# Patient Record
Sex: Female | Born: 1980 | Race: Black or African American | Hispanic: No | Marital: Single | Smoking: Current every day smoker
Health system: Southern US, Community
[De-identification: ages and names within clinical notes are randomized; demographics above are authoritative.]

## PROBLEM LIST (undated history)

## (undated) DIAGNOSIS — D509 Iron deficiency anemia, unspecified: Secondary | ICD-10-CM

## (undated) DIAGNOSIS — B009 Herpesviral infection, unspecified: Secondary | ICD-10-CM

## (undated) DIAGNOSIS — Z8619 Personal history of other infectious and parasitic diseases: Secondary | ICD-10-CM

## (undated) DIAGNOSIS — Z8719 Personal history of other diseases of the digestive system: Secondary | ICD-10-CM

## (undated) DIAGNOSIS — K219 Gastro-esophageal reflux disease without esophagitis: Secondary | ICD-10-CM

## (undated) DIAGNOSIS — Z8711 Personal history of peptic ulcer disease: Secondary | ICD-10-CM

## (undated) HISTORY — DX: Personal history of other infectious and parasitic diseases: Z86.19

## (undated) HISTORY — DX: Personal history of other diseases of the digestive system: Z87.19

## (undated) HISTORY — DX: Herpesviral infection, unspecified: B00.9

## (undated) HISTORY — DX: Personal history of peptic ulcer disease: Z87.11

## (undated) HISTORY — PX: ESOPHAGOGASTRODUODENOSCOPY: SHX1529

---
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
## 2013-01-20 HISTORY — PX: OTHER SURGICAL HISTORY: SHX169

## 2016-11-20 LAB — HM PAP SMEAR: HM Pap smear: NORMAL

## 2017-07-13 ENCOUNTER — Ambulatory Visit: Payer: Self-pay | Admitting: Family

## 2017-07-15 ENCOUNTER — Ambulatory Visit: Payer: BC Managed Care – PPO | Admitting: Family

## 2017-07-15 ENCOUNTER — Encounter: Payer: Self-pay | Admitting: Family

## 2017-07-15 VITALS — BP 120/76 | HR 73 | Temp 98.1°F | Resp 16 | Ht 62.5 in | Wt 148.0 lb

## 2017-07-15 DIAGNOSIS — F439 Reaction to severe stress, unspecified: Secondary | ICD-10-CM | POA: Diagnosis not present

## 2017-07-15 DIAGNOSIS — K219 Gastro-esophageal reflux disease without esophagitis: Secondary | ICD-10-CM | POA: Insufficient documentation

## 2017-07-15 DIAGNOSIS — J309 Allergic rhinitis, unspecified: Secondary | ICD-10-CM | POA: Insufficient documentation

## 2017-07-15 DIAGNOSIS — F4329 Adjustment disorder with other symptoms: Secondary | ICD-10-CM | POA: Diagnosis not present

## 2017-07-15 NOTE — Progress Notes (Signed)
Subjective:    Patient ID: Emily Robbins, female    DOB: [birth year], 37 y.o.   MRN: [id number]  HPI  Pt is a 37 yr old female who presents today to establish care. Having work stress and family stress.  Moved here recently from [place]. Reports that she has a lot of stress with her dad who has never been very supportive of her.  Her partner is not working currently so she is the sole income for her family which she finds to be very stressful. She does not feel welcomed in her current community in [place] and hopes to move in the near future.  Denies depression symptoms. She is open to counseling.   Hx of ulcers- reports that she had an endoscopy at age 18.  She was treated with abx and pantoprazole.  Reports that she has intermittent reflux symptoms.    Seasonal allergies- mostly in the spring, takes zyrtec.   Review of Systems    see HPI  Past Medical History:  Diagnosis Date  . History of stomach ulcers      Social History   Socioeconomic History  . Marital status: Single    Spouse name: Not on file  . Number of children: Not on file  . Years of education: Not on file  . Highest education level: Not on file  Occupational History  . Not on file  Social Needs  . Financial resource strain: Not on file  . Food insecurity:    Worry: Not on file    Inability: Not on file  . Transportation needs:    Medical: Not on file    Non-medical: Not on file  Tobacco Use  . Smoking status: Current Every Day Smoker    Packs/day: 5.00    Start date: 01/21/2000  . Smokeless tobacco: Never Used  Substance and Sexual Activity  . Alcohol use: Yes    Alcohol/week: 0.6 oz    Types: 1 Standard drinks or equivalent per week    Comment: occasional  . Drug use: Not on file  . Sexual activity: Not on file  Lifestyle  . Physical activity:    Days per week: Not on file    Minutes per session: Not on file  . Stress: Not on file  Relationships  . Social connections:    Talks  on phone: Not on file    Gets together: Not on file    Attends religious service: Not on file    Active member of club or organization: Not on file    Attends meetings of clubs or organizations: Not on file    Relationship status: Not on file  . Intimate partner violence:    Fear of current or ex partner: Not on file    Emotionally abused: Not on file    Physically abused: Not on file    Forced sexual activity: Not on file  Other Topics Concern  . 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 OTHER SURGICAL HISTORY  2015   boil removed from buttock    Family History  Problem Relation Age of Onset  . Hypertension Mother   . Other Mother        borderline diabetes  . Hypertension Father   . Asthma Father   .  Hyperlipidemia Father   . Hypertension Brother   . Hypertension Maternal Grandmother   . Diabetes Maternal Grandmother   . Seizures Maternal Grandmother   . Hypertension Brother     No Known Allergies  Current Outpatient Medications on File Prior to Visit  Medication Sig Dispense Refill  . cetirizine (ZYRTEC) 10 MG tablet Take 10 mg by mouth daily as needed for allergies.     No current facility-administered medications on file prior to visit.     BP 120/76 (BP Location: Right Arm, Cuff Size: Normal)   Pulse 73   Temp 98.1 F (36.7 C) (Oral)   Resp 16   Ht 5' 2.5" (1.588 m)   Wt 148 lb (67.1 kg)   LMP 07/11/2017   SpO2 100%   BMI 26.64 kg/m    Objective:   Physical Exam  Constitutional: She is oriented to person, place, and time. She appears well-developed and well-nourished.  Cardiovascular: Normal rate, regular rhythm and normal heart sounds.  No murmur heard. Pulmonary/Chest: Effort normal and breath sounds normal. No respiratory distress. She has no wheezes.    Musculoskeletal: She exhibits no edema.  Neurological: She is alert and oriented to person, place, and time.  Skin: Skin is warm and dry.  Psychiatric: She has a normal mood and affect. Her behavior is normal. Judgment and thought content normal.          Assessment & Plan:  Stress- I think she would really benefit from counseling. I do not see any indication for medication at this time.   A total of 30  minutes were spent face-to-face with the patient during this encounter and over half of that time was spent on counseling and coordination of care. The patient was counseled on stress management, importance of counseling, gerd diet.

## 2017-07-15 NOTE — Assessment & Plan Note (Signed)
Intermittent gerd symptoms. Advised pt on gerd diet and prn use of antacids such as tums. If needed we can consider adding PPI back but at this time she seems to be doing well off of medication.

## 2017-07-15 NOTE — Patient Instructions (Addendum)
Please schedule an appointment with a counselor. Work on reflux friendly diet.   Food Choices for Gastroesophageal Reflux Disease, Adult When you have gastroesophageal reflux disease (GERD), the foods you eat and your eating habits are very important. Choosing the right foods can help ease your discomfort. What guidelines do I need to follow?  Choose fruits, vegetables, whole grains, and low-fat dairy products.  Choose low-fat meat, fish, and poultry.  Limit fats such as oils, salad dressings, butter, nuts, and avocado.  Keep a food diary. This helps you identify foods that cause symptoms.  Avoid foods that cause symptoms. These may be different for everyone.  Eat small meals often instead of 3 large meals a day.  Eat your meals slowly, in a place where you are relaxed.  Limit fried foods.  Cook foods using methods other than frying.  Avoid drinking alcohol.  Avoid drinking large amounts of liquids with your meals.  Avoid bending over or lying down until 2-3 hours after eating. What foods are not recommended? These are some foods and drinks that may make your symptoms worse: Vegetables Tomatoes. Tomato juice. Tomato and spaghetti sauce. Chili peppers. Onion and garlic. Horseradish. Fruits Oranges, grapefruit, and lemon (fruit and juice). Meats High-fat meats, fish, and poultry. This includes hot dogs, ribs, ham, sausage, salami, and bacon. Dairy Whole milk and chocolate milk. Sour cream. Cream. Butter. Ice cream. Cream cheese. Drinks Coffee and tea. Bubbly (carbonated) drinks or energy drinks. Condiments Hot sauce. Barbecue sauce. Sweets/Desserts Chocolate and cocoa. Donuts. Peppermint and spearmint. Fats and Oils High-fat foods. This includes [place] fries and potato chips. Other Vinegar. Strong spices. This includes black pepper, white pepper, red pepper, cayenne, curry powder, cloves, ginger, and chili powder. The items listed above may not be a complete list of  foods and drinks to avoid. Contact your dietitian for more information. This information is not intended to replace advice given to you by your health care provider. Make sure you discuss any questions you have with your health care provider. Document Released: 07/08/2011 Document Revised: 06/14/2015 Document Reviewed: 11/10/2012 Elsevier Interactive Patient Education  2017 [employer].

## 2017-07-15 NOTE — Assessment & Plan Note (Signed)
Seasonal and controlled with prn use of zofran.

## 2017-08-21 ENCOUNTER — Encounter: Payer: Self-pay | Admitting: Family

## 2017-08-21 ENCOUNTER — Ambulatory Visit: Payer: BC Managed Care – PPO | Admitting: Family

## 2017-08-21 VITALS — BP 126/88 | HR 83 | Temp 98.4°F | Resp 16 | Ht 63.0 in | Wt 149.0 lb

## 2017-08-21 DIAGNOSIS — N946 Dysmenorrhea, unspecified: Secondary | ICD-10-CM | POA: Diagnosis not present

## 2017-08-21 DIAGNOSIS — K219 Gastro-esophageal reflux disease without esophagitis: Secondary | ICD-10-CM | POA: Diagnosis not present

## 2017-08-21 DIAGNOSIS — F4329 Adjustment disorder with other symptoms: Secondary | ICD-10-CM

## 2017-08-21 NOTE — Patient Instructions (Signed)
Please complete lab work prior to leaving.   

## 2017-08-21 NOTE — Progress Notes (Signed)
Subjective:    Patient ID: Emily Robbins, female    DOB: [birth year], 37 y.o.   MRN: [id number]  HPI  [name] is a 37 yr old female who presents today for follow up.  Stress- partner is working now.  Reports decreased stress recently.   GERD-  Improved. Has been working on diet and increasing her water intake.   Premenstrual cramping- cramps x 1 week prior to menses.  Has nipple soreness.  She reports that she has been on oral contraceptives in the past but had a lot of spotting with this.  She has also taken Depakote in the past and it "blew me up".  Review of Systems See HPI  Past Medical History:  Diagnosis Date  . History of stomach ulcers      Social History   Socioeconomic History  . Marital status: Single    Spouse name: Not on file  . Number of children: Not on file  . Years of education: Not on file  . Highest education level: Not on file  Occupational History  . Not on file  Social Needs  . Financial resource strain: Not on file  . Food insecurity:    Worry: Not on file    Inability: Not on file  . Transportation needs:    Medical: Not on file    Non-medical: Not on file  Tobacco Use  . Smoking status: Current Every Day Smoker    Packs/day: 5.00    Start date: 01/21/2000  . Smokeless tobacco: Never Used  Substance and Sexual Activity  . Alcohol use: Yes    Alcohol/week: 0.6 oz    Types: 1 Standard drinks or equivalent per week    Comment: occasional  . Drug use: Not on file  . Sexual activity: Not on file  Lifestyle  . Physical activity:    Days per week: Not on file    Minutes per session: Not on file  . Stress: Not on file  Relationships  . Social connections:    Talks on phone: Not on file    Gets together: Not on file    Attends religious service: Not on file    Active member of club or organization: Not on file    Attends meetings of clubs or organizations: Not on file    Relationship status: Not on file  . Intimate partner  violence:    Fear of current or ex partner: Not on file    Emotionally abused: Not on file    Physically abused: Not on file    Forced sexual activity: Not on file  Other Topics Concern  . 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 OTHER SURGICAL HISTORY  2015   boil removed from buttock    Family History  Problem Relation Age of Onset  . Hypertension Mother   . Other Mother        borderline diabetes  . Hypertension Father   . Asthma Father   . Hyperlipidemia Father   . Hypertension Brother   . Hypertension Maternal Grandmother   . Diabetes Maternal Grandmother   . Seizures Maternal Grandmother   . Hypertension Brother     No Known Allergies  Current Outpatient Medications on  File Prior to Visit  Medication Sig Dispense Refill  . cetirizine (ZYRTEC) 10 MG tablet Take 10 mg by mouth daily as needed for allergies.     No current facility-administered medications on file prior to visit.     BP 126/88 (BP Location: Right Arm, Cuff Size: Normal)   Pulse 83   Temp 98.4 F (36.9 C) (Oral)   Resp 16   Ht 5\' 3"  (1.6 m)   Wt 149 lb (67.6 kg)   LMP 07/20/2017   SpO2 100%   BMI 26.39 kg/m       Objective:   Physical Exam  Constitutional: She appears well-developed and well-nourished.  Cardiovascular: Normal rate, regular rhythm and normal heart sounds.  No murmur heard. Pulmonary/Chest: Effort normal and breath sounds normal. No respiratory distress. She has no wheezes.  Psychiatric: She has a normal mood and affect. Her behavior is normal. Judgment and thought content normal.          Assessment & Plan:  Stress- she reports improvement in her stress level.  She did not end up establishing with a counselor as things were improving.  Encouraged her to  reach out to counselor if she feels let she needs it in the future.  Dysmenorrhea-we discussed possibility of initiating a different oral contraceptive.  She declines at this time wishes to continue to use Tylenol as needed.  GERD-GERD symptoms are improved with dietary changes.  We will continue to monitor.

## 2017-09-23 ENCOUNTER — Encounter: Payer: Self-pay | Admitting: Family

## 2017-09-24 ENCOUNTER — Telehealth: Payer: BC Managed Care – PPO | Admitting: Family

## 2017-09-24 ENCOUNTER — Encounter: Payer: Self-pay | Admitting: Family

## 2017-09-24 DIAGNOSIS — R21 Rash and other nonspecific skin eruption: Secondary | ICD-10-CM

## 2017-09-24 MED ORDER — PREDNISONE 5 MG PO TABS: 5.0000 mg | ORAL_TABLET | ORAL | 0 refills | Status: DC

## 2017-09-24 NOTE — Progress Notes (Signed)
Thank you for the details you included in the comment boxes. Those details are very helpful in determining the best course of treatment for you and help [place] to provide the best care.  E Visit for Rash  We are sorry that you are not feeling well. Here is how we plan to help!   Based on what you shared with me you may have a virus or an allergic reaction.  Avoid contact with pregnant women until a diagnosis is made.  Most viral rashes are contagious (especially if a fever is present).  You can return to work or school after the rash is gone or when your doctor says it is safe to return with the rash. If you do not have a fever, then it is likely just an allergic reaction and the precautions about being at work and around other people does NOT apply. This is part of our template, but usually there is no fever.    Prednisone 5 mg daily for 6 days (see taper instructions below)  Directions for 6 day taper: Day 1: 2 tablets before breakfast, 1 after both lunch & dinner and 2 at bedtime Day 2: 1 tab before breakfast, 1 after both lunch & dinner and 2 at bedtime Day 3: 1 tab at each meal & 1 at bedtime Day 4: 1 tab at breakfast, 1 at lunch, 1 at bedtime Day 5: 1 tab at breakfast & 1 tab at bedtime Day 6: 1 tab at breakfast    HOME CARE:   Take cool showers and avoid direct sunlight.  Apply cool compress or wet dressings.  Take a bath in an oatmeal bath.  Sprinkle content of one Aveeno packet under running faucet with comfortably warm water.  Bathe for 15-20 minutes, 1-2 times daily.  Pat dry with a towel. Do not rub the rash.  Use hydrocortisone cream.  Take an antihistamine like Benadryl for widespread rashes that itch.  The adult dose of Benadryl is 25-50 mg by mouth 4 times daily.  Caution:  This type of medication may cause sleepiness.  Do not drink alcohol, drive, or operate dangerous machinery while taking antihistamines.  Do not take these medications if you have prostate  enlargement.  Read package instructions thoroughly on all medications that you take.  GET HELP RIGHT AWAY IF:   Symptoms don't go away after treatment.  Severe itching that persists.  If you rash spreads or swells.  If you rash begins to smell.  If it blisters and opens or develops a yellow-brown crust.  You develop a fever.  You have a sore throat.  You become short of breath.  MAKE SURE YOU:  Understand these instructions. Will watch your condition. Will get help right away if you are not doing well or get worse.  Thank you for choosing an e-visit. Your e-visit answers were reviewed by a board certified advanced clinical practitioner to complete your personal care plan. Depending upon the condition, your plan could have included both over the counter or prescription medications. Please review your pharmacy choice. Be sure that the pharmacy you have chosen is open so that you can pick up your prescription now.  If there is a problem you may message your provider in [place] to have the prescription routed to another pharmacy. Your safety is important to [place]. If you have drug allergies check your prescription carefully.  For the next 24 hours, you can use MyChart to ask questions about today's visit, request a non-urgent call  back, or ask for a work or school excuse from your e-visit provider. You will get an email in the next two days asking about your experience. I hope that your e-visit has been valuable and will speed your recovery.

## 2017-09-25 NOTE — Telephone Encounter (Signed)
See previous email and response.

## 2017-10-06 ENCOUNTER — Encounter: Payer: Self-pay | Admitting: Family

## 2017-10-06 ENCOUNTER — Ambulatory Visit (INDEPENDENT_AMBULATORY_CARE_PROVIDER_SITE_OTHER): Payer: BC Managed Care – PPO | Admitting: Family

## 2017-10-06 VITALS — BP 110/68 | HR 74 | Temp 98.8°F | Resp 16 | Ht 62.7 in | Wt 145.6 lb

## 2017-10-06 DIAGNOSIS — Z0000 Encounter for general adult medical examination without abnormal findings: Secondary | ICD-10-CM | POA: Diagnosis not present

## 2017-10-06 LAB — CBC WITH DIFFERENTIAL/PLATELET
BASOS ABS: 0.1 10*3/uL (ref 0.0–0.1)
Basophils Relative: 1.3 % (ref 0.0–3.0)
Eosinophils Absolute: 0.3 10*3/uL (ref 0.0–0.7)
Eosinophils Relative: 4 % (ref 0.0–5.0)
HCT: 38.2 % (ref 36.0–46.0)
Hemoglobin: 12.2 g/dL (ref 12.0–15.0)
LYMPHS ABS: 2.9 10*3/uL (ref 0.7–4.0)
Lymphocytes Relative: 32.7 % (ref 12.0–46.0)
MCHC: 31.9 g/dL (ref 30.0–36.0)
MCV: 79.9 fl (ref 78.0–100.0)
MONO ABS: 0.9 10*3/uL (ref 0.1–1.0)
Monocytes Relative: 9.9 % (ref 3.0–12.0)
NEUTROS ABS: 4.6 10*3/uL (ref 1.4–7.7)
NEUTROS PCT: 52.1 % (ref 43.0–77.0)
PLATELETS: 371 10*3/uL (ref 150.0–400.0)
RBC: 4.79 Mil/uL (ref 3.87–5.11)
RDW: 18.6 % — ABNORMAL HIGH (ref 11.5–15.5)
WBC: 8.7 10*3/uL (ref 4.0–10.5)

## 2017-10-06 LAB — BASIC METABOLIC PANEL
BUN: 16 mg/dL (ref 6–23)
CALCIUM: 9.7 mg/dL (ref 8.4–10.5)
CO2: 25 mEq/L (ref 19–32)
Chloride: 106 mEq/L (ref 96–112)
Creatinine, Ser: 0.8 mg/dL (ref 0.40–1.20)
GFR: 104.92 mL/min (ref >60.00)
GLUCOSE: 83 mg/dL (ref 70–99)
Potassium: 4.4 mEq/L (ref 3.5–5.1)
Sodium: 138 mEq/L (ref 135–145)

## 2017-10-06 LAB — LIPID PANEL
CHOL/HDL RATIO: 4
Cholesterol: 176 mg/dL (ref 0–200)
HDL: 43.9 mg/dL (ref >39.00)
LDL Cholesterol: 115 mg/dL — ABNORMAL HIGH (ref 0–99)
NONHDL: 132.31
Triglycerides: 88 mg/dL (ref 0.0–149.0)
VLDL: 17.6 mg/dL (ref 0.0–40.0)

## 2017-10-06 LAB — HEPATIC FUNCTION PANEL
ALK PHOS: 53 U/L (ref 39–117)
ALT: 8 U/L (ref 0–35)
AST: 11 U/L (ref 0–37)
Albumin: 4.3 g/dL (ref 3.5–5.2)
BILIRUBIN TOTAL: 0.2 mg/dL (ref 0.2–1.2)
Bilirubin, Direct: 0 mg/dL (ref 0.0–0.3)
Total Protein: 7.2 g/dL (ref 6.0–8.3)

## 2017-10-06 LAB — URINALYSIS, ROUTINE W REFLEX MICROSCOPIC
Bilirubin Urine: NEGATIVE
HGB URINE DIPSTICK: NEGATIVE
KETONES UR: NEGATIVE
Leukocytes, UA: NEGATIVE
NITRITE: NEGATIVE
PH: 6.5 (ref 5.0–8.0)
RBC / HPF: NONE SEEN (ref 0–?)
Specific Gravity, Urine: 1.02 (ref 1.000–1.030)
TOTAL PROTEIN, URINE-UPE24: NEGATIVE
UROBILINOGEN UA: 0.2 (ref 0.0–1.0)
Urine Glucose: NEGATIVE

## 2017-10-06 LAB — TSH: TSH: 0.77 u[IU]/mL (ref 0.35–4.50)

## 2017-10-06 NOTE — Patient Instructions (Addendum)
Please schedule routine dental and vision exam.  Complete lab work prior to leaving.

## 2017-10-06 NOTE — Progress Notes (Signed)
Subjective:    Patient ID: Emily Robbins, female    DOB: 10/13/1980, 37 y.o.   MRN: [id number]  HPI  Patient presents today for complete physical.  Immunizations:  Tetanus 2015, declines flu shot Diet: healthy Wt Readings from Last 3 Encounters:  10/06/17 145 lb 9.6 oz (66 kg)  08/21/17 149 lb (67.6 kg)  07/15/17 148 lb (67.1 kg)  Exercise: not exercising Pap Smear: 11/20/16 Dental: due Vision: up to date  Review of Systems  Constitutional: Negative for unexpected weight change.  HENT: Negative for hearing loss and rhinorrhea.   Eyes: Negative for visual disturbance.  Respiratory: Negative for cough.   Cardiovascular: Negative for leg swelling.  Gastrointestinal: Negative for blood in stool, constipation and diarrhea.  Genitourinary: Negative for dysuria, frequency and hematuria.  Musculoskeletal: Negative for arthralgias and myalgias.  Skin: Negative for rash.  Neurological:       Occasional headaches  Hematological: Negative for adenopathy.  Psychiatric/Behavioral:       + anxiety related to work.     Past Medical History:  Diagnosis Date  . History of stomach ulcers      Social History   Socioeconomic History  . Marital status: Single    Spouse name: Not on file  . Number of children: Not on file  . Years of education: Not on file  . Highest education level: Not on file  Occupational History  . Not on file  Social Needs  . Financial resource strain: Not on file  . Food insecurity:    Worry: Not on file    Inability: Not on file  . Transportation needs:    Medical: Not on file    Non-medical: Not on file  Tobacco Use  . Smoking status: Current Every Day Smoker    Packs/day: 5.00    Start date: 01/21/2000  . Smokeless tobacco: Never Used  Substance and Sexual Activity  . Alcohol use: Yes    Alcohol/week: 1.0 standard drinks    Types: 1 Standard drinks or equivalent per week    Comment: occasional  . Drug use: Not on file  . Sexual activity:  Not on file  Lifestyle  . Physical activity:    Days per week: Not on file    Minutes per session: Not on file  . Stress: Not on file  Relationships  . Social connections:    Talks on phone: Not on file    Gets together: Not on file    Attends religious service: Not on file    Active member of club or organization: Not on file    Attends meetings of clubs or organizations: Not on file    Relationship status: Not on file  . Intimate partner violence:    Fear of current or ex partner: Not on file    Emotionally abused: Not on file    Physically abused: Not on file    Forced sexual activity: Not on file  Other Topics Concern  . 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 OTHER SURGICAL HISTORY  2015   boil removed from buttock    Family History  Problem Relation Age of Onset  . Hypertension Mother   . Other Mother  borderline diabetes  . Hypertension Father   . Asthma Father   . Hyperlipidemia Father   . Hypertension Brother   . Hypertension Maternal Grandmother   . Diabetes Maternal Grandmother   . Seizures Maternal Grandmother   . Hypertension Brother     No Known Allergies  Current Outpatient Medications on File Prior to Visit  Medication Sig Dispense Refill  . cetirizine (ZYRTEC) 10 MG tablet Take 10 mg by mouth daily as needed for allergies.     No current facility-administered medications on file prior to visit.     BP 110/68 (BP Location: Right Arm, Patient Position: Sitting, Cuff Size: Small)   Pulse 74   Temp 98.8 F (37.1 C) (Oral)   Resp 16   Ht 5' 2.7" (1.593 m)   Wt 145 lb 9.6 oz (66 kg)   LMP 09/18/2017 (Approximate)   SpO2 100%   BMI 26.04 kg/m       Objective:   Physical Exam  Physical Exam  Constitutional: She is  oriented to person, place, and time. She appears well-developed and well-nourished. No distress.  HENT:  Head: Normocephalic and atraumatic.  Right Ear: Tympanic membrane and ear canal normal.  Left Ear: Tympanic membrane and ear canal normal.  Mouth/Throat: Oropharynx is clear and moist.  Eyes: Pupils are equal, round, and reactive to light. No scleral icterus.  Neck: Normal range of motion. No thyromegaly present.  Cardiovascular: Normal rate and regular rhythm.   No murmur heard. Pulmonary/Chest: Effort normal and breath sounds normal. No respiratory distress. He has no wheezes. She has no rales. She exhibits no tenderness.  Abdominal: Soft. Bowel sounds are normal. She exhibits no distension and no mass. There is no tenderness. There is no rebound and no guarding.  Musculoskeletal: She exhibits no edema.  Lymphadenopathy:    She has no cervical adenopathy.  Neurological: She is alert and oriented to person, place, and time. She has normal patellar reflexes. She exhibits normal muscle tone. Coordination normal.  Skin: Skin is warm and dry.  Psychiatric: She has a normal mood and affect. Her behavior is normal. Judgment and thought content normal.  Breasts: Examined lying Right: Without masses, retractions, discharge or axillary adenopathy.  Left: Without masses, retractions, discharge or axillary adenopathy.         Assessment & Plan:  Preventative care- tetanus up to date, declines flu shot. Obtain routine lab work. Pap up to date. We discussed having her establish with a counselor to manage her work stress.        Assessment & Plan:

## 2017-10-07 ENCOUNTER — Encounter: Payer: Self-pay | Admitting: Family

## 2017-10-07 LAB — HIV ANTIBODY (ROUTINE TESTING W REFLEX): HIV: NONREACTIVE

## 2017-11-14 ENCOUNTER — Encounter: Payer: Self-pay | Admitting: Family

## 2017-11-16 MED ORDER — PANTOPRAZOLE SODIUM 40 MG PO TBEC: 40.0000 mg | DELAYED_RELEASE_TABLET | Freq: Every day | ORAL | 3 refills | Status: DC

## 2017-11-16 NOTE — Telephone Encounter (Signed)
Melissa -- it looks like Pantoprazole was on pt's med list as historical but had been removed. Please advise request?

## 2017-11-20 ENCOUNTER — Ambulatory Visit: Payer: BC Managed Care – PPO | Admitting: Family

## 2018-03-01 ENCOUNTER — Other Ambulatory Visit: Payer: Self-pay | Admitting: Family

## 2018-04-09 ENCOUNTER — Ambulatory Visit: Payer: BC Managed Care – PPO | Admitting: Family

## 2018-04-09 ENCOUNTER — Encounter: Payer: Self-pay | Admitting: Family

## 2018-04-09 ENCOUNTER — Other Ambulatory Visit: Payer: Self-pay

## 2018-04-09 ENCOUNTER — Other Ambulatory Visit: Payer: Self-pay | Admitting: Family

## 2018-04-09 VITALS — BP 126/88 | HR 78 | Temp 99.1°F | Resp 16 | Ht 62.0 in | Wt 151.0 lb

## 2018-04-09 DIAGNOSIS — M79671 Pain in right foot: Secondary | ICD-10-CM | POA: Diagnosis not present

## 2018-04-09 DIAGNOSIS — R1909 Other intra-abdominal and pelvic swelling, mass and lump: Secondary | ICD-10-CM

## 2018-04-09 MED ORDER — CETIRIZINE HCL 10 MG PO TABS: 10.0000 mg | ORAL_TABLET | Freq: Every day | ORAL | 5 refills | Status: DC

## 2018-04-09 MED ORDER — FAMOTIDINE 20 MG PO TABS: 20.0000 mg | ORAL_TABLET | Freq: Two times a day (BID) | ORAL | 5 refills | Status: DC

## 2018-04-09 NOTE — Patient Instructions (Signed)
You should be contacted about scheduling your imaging studies.

## 2018-04-09 NOTE — Progress Notes (Signed)
Subjective:    Patient ID: Emily Robbins, female    DOB: 1980-03-10, 38 y.o.   MRN: [id number]  HPI   Patient is a 38 yr old female who presents today with chief complaint of right foot pain. Pain has been present for several months. Pain has worsened since last week.  Reports that she felt a pop in the back of her right foot.   Reports cooler door slammed her right foot and hurt her right second toe.    She also notes that she notes a bulging in her lower abdomen in the AM which is improved after urination.   Review of Systems See HPI  Past Medical History:  Diagnosis Date  . History of stomach ulcers      Social History   Socioeconomic History  . Marital status: Single    Spouse name: Not on file  . Number of children: Not on file  . Years of education: Not on file  . Highest education level: Not on file  Occupational History  . Not on file  Social Needs  . Financial resource strain: Not on file  . Food insecurity:    Worry: Not on file    Inability: Not on file  . Transportation needs:    Medical: Not on file    Non-medical: Not on file  Tobacco Use  . Smoking status: Current Every Day Smoker    Packs/day: 5.00    Start date: 01/21/2000  . Smokeless tobacco: Never Used  Substance and Sexual Activity  . Alcohol use: Yes    Alcohol/week: 1.0 standard drinks    Types: 1 Standard drinks or equivalent per week    Comment: occasional  . Drug use: Not on file  . Sexual activity: Not on file  Lifestyle  . Physical activity:    Days per week: Not on file    Minutes per session: Not on file  . Stress: Not on file  Relationships  . Social connections:    Talks on phone: Not on file    Gets together: Not on file    Attends religious service: Not on file    Active member of club or organization: Not on file    Attends meetings of clubs or organizations: Not on file    Relationship status: Not on file  . Intimate partner violence:    Fear of current or  ex partner: Not on file    Emotionally abused: Not on file    Physically abused: Not on file    Forced sexual activity: Not on file  Other Topics Concern  . 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 OTHER SURGICAL HISTORY  2015   boil removed from buttock    Family History  Problem Relation Age of Onset  . Hypertension Mother   . Other Mother        borderline diabetes  . Hypertension Father   . Asthma Father   . Hyperlipidemia Father   . Hypertension Brother   . Hypertension Maternal Grandmother   . Diabetes Maternal Grandmother   . Seizures Maternal Grandmother   . Hypertension Brother     No Known Allergies  No current outpatient medications on file prior to visit.  No current facility-administered medications on file prior to visit.     BP 126/88 (BP Location: Right Arm, Patient Position: Sitting, Cuff Size: Small)   Pulse 78   Temp 99.1 F (37.3 C) (Oral)   Resp 16   Ht 5\' 2"  (1.575 m)   Wt 151 lb (68.5 kg)   SpO2 100%   BMI 27.62 kg/m       Objective:   Physical Exam Constitutional:      Appearance: She is well-developed.  Neck:     Musculoskeletal: Neck supple.     Thyroid: No thyromegaly.  Cardiovascular:     Rate and Rhythm: Normal rate and regular rhythm.     Heart sounds: Normal heart sounds. No murmur.  Pulmonary:     Effort: Pulmonary effort is normal. No respiratory distress.     Breath sounds: Normal breath sounds. No wheezing.  Abdominal:     Comments: Firm non-tender suprapubic mass noted. Grapefruit sized  Musculoskeletal:     Comments: Right foot is without swelling  Skin:    General: Skin is warm and dry.  Neurological:     Mental Status: She is alert and oriented to person, place, and time.  Psychiatric:         Behavior: Behavior normal.        Thought Content: Thought content normal.        Judgment: Judgment normal.           Assessment & Plan:  Suprapubic mass- reports no chance of pregnancy as she is not sexually active with men. Obtain pelvic US for further evaluation.  R foot pain- obtain x-ray to rule out fracture.

## 2018-04-20 ENCOUNTER — Encounter: Payer: Self-pay | Admitting: Family

## 2018-05-10 ENCOUNTER — Encounter (HOSPITAL_BASED_OUTPATIENT_CLINIC_OR_DEPARTMENT_OTHER): Payer: Self-pay

## 2018-05-10 ENCOUNTER — Encounter: Payer: Self-pay | Admitting: Family

## 2018-05-10 ENCOUNTER — Ambulatory Visit (HOSPITAL_BASED_OUTPATIENT_CLINIC_OR_DEPARTMENT_OTHER)
Admission: RE | Admit: 2018-05-10 | Discharge: 2018-05-10 | Disposition: A | Discharge status: Home / Self Care | Payer: BC Managed Care – PPO | Source: Ambulatory Visit | Attending: Family | Admitting: Family

## 2018-05-10 ENCOUNTER — Other Ambulatory Visit: Payer: Self-pay

## 2018-05-10 ENCOUNTER — Telehealth: Payer: Self-pay | Admitting: Family

## 2018-05-10 DIAGNOSIS — M79671 Pain in right foot: Secondary | ICD-10-CM | POA: Insufficient documentation

## 2018-05-10 DIAGNOSIS — R1909 Other intra-abdominal and pelvic swelling, mass and lump: Secondary | ICD-10-CM

## 2018-05-10 HISTORY — DX: Personal history of other diseases of the digestive system: Z87.19

## 2018-05-10 NOTE — Telephone Encounter (Signed)
Patient advised of [place] and xray results

## 2018-05-10 NOTE — Telephone Encounter (Signed)
Also, please let her know that her foot xray is negative for fracture.

## 2018-05-10 NOTE — Telephone Encounter (Signed)
Please let pt know that her US shows a large fibroid(9 cm wide)  which is what we are feeling and she is seeing above her pubic bone.  Let me know if she develops pain, heavy periods or urinary problems. Otherwise we will keep an eye on it.

## 2018-09-23 ENCOUNTER — Encounter: Payer: Self-pay | Admitting: Family

## 2018-09-24 MED ORDER — PANTOPRAZOLE SODIUM 40 MG PO TBEC: 40.0000 mg | DELAYED_RELEASE_TABLET | Freq: Every day | ORAL | 5 refills | Status: DC

## 2018-10-10 ENCOUNTER — Encounter: Payer: Self-pay | Admitting: Family

## 2018-10-11 MED ORDER — CETIRIZINE HCL 10 MG PO TABS: 10.0000 mg | ORAL_TABLET | Freq: Every day | ORAL | 5 refills | Status: DC

## 2018-10-28 ENCOUNTER — Other Ambulatory Visit: Payer: Self-pay

## 2018-10-29 ENCOUNTER — Encounter: Payer: Self-pay | Admitting: Family

## 2018-10-29 ENCOUNTER — Other Ambulatory Visit (INDEPENDENT_AMBULATORY_CARE_PROVIDER_SITE_OTHER): Payer: BC Managed Care – PPO

## 2018-10-29 ENCOUNTER — Ambulatory Visit (INDEPENDENT_AMBULATORY_CARE_PROVIDER_SITE_OTHER): Payer: BC Managed Care – PPO | Admitting: Family

## 2018-10-29 ENCOUNTER — Other Ambulatory Visit: Payer: Self-pay

## 2018-10-29 VITALS — BP 124/74 | HR 81 | Temp 98.5°F | Resp 16 | Ht 62.0 in | Wt 150.2 lb

## 2018-10-29 DIAGNOSIS — D259 Leiomyoma of uterus, unspecified: Secondary | ICD-10-CM

## 2018-10-29 DIAGNOSIS — D649 Anemia, unspecified: Secondary | ICD-10-CM | POA: Diagnosis not present

## 2018-10-29 DIAGNOSIS — Z0000 Encounter for general adult medical examination without abnormal findings: Secondary | ICD-10-CM

## 2018-10-29 LAB — LIPID PANEL
Cholesterol: 172 mg/dL (ref 0–200)
HDL: 46.2 mg/dL (ref >39.00)
LDL Cholesterol: 109 mg/dL — ABNORMAL HIGH (ref 0–99)
NonHDL: 125.77
Total CHOL/HDL Ratio: 4
Triglycerides: 86 mg/dL (ref 0.0–149.0)
VLDL: 17.2 mg/dL (ref 0.0–40.0)

## 2018-10-29 LAB — HEPATIC FUNCTION PANEL
ALT: 7 U/L (ref 0–35)
AST: 12 U/L (ref 0–37)
Albumin: 4.1 g/dL (ref 3.5–5.2)
Alkaline Phosphatase: 53 U/L (ref 39–117)
Bilirubin, Direct: 0.1 mg/dL (ref 0.0–0.3)
Total Bilirubin: 0.2 mg/dL (ref 0.2–1.2)
Total Protein: 6.8 g/dL (ref 6.0–8.3)

## 2018-10-29 LAB — CBC WITH DIFFERENTIAL/PLATELET
Basophils Absolute: 0.1 10*3/uL (ref 0.0–0.1)
Basophils Relative: 1.2 % (ref 0.0–3.0)
Eosinophils Absolute: 0.3 10*3/uL (ref 0.0–0.7)
Eosinophils Relative: 4 % (ref 0.0–5.0)
HCT: 34 % — ABNORMAL LOW (ref 36.0–46.0)
Hemoglobin: 10.6 g/dL — ABNORMAL LOW (ref 12.0–15.0)
Lymphocytes Relative: 28.7 % (ref 12.0–46.0)
Lymphs Abs: 2.4 10*3/uL (ref 0.7–4.0)
MCHC: 31.1 g/dL (ref 30.0–36.0)
MCV: 78.4 fl (ref 78.0–100.0)
Monocytes Absolute: 0.8 10*3/uL (ref 0.1–1.0)
Monocytes Relative: 10 % (ref 3.0–12.0)
Neutro Abs: 4.6 10*3/uL (ref 1.4–7.7)
Neutrophils Relative %: 56.1 % (ref 43.0–77.0)
Platelets: 394 10*3/uL (ref 150.0–400.0)
RBC: 4.33 Mil/uL (ref 3.87–5.11)
RDW: 18.9 % — ABNORMAL HIGH (ref 11.5–15.5)
WBC: 8.2 10*3/uL (ref 4.0–10.5)

## 2018-10-29 LAB — TSH: TSH: 1.46 u[IU]/mL (ref 0.35–4.50)

## 2018-10-29 LAB — BASIC METABOLIC PANEL
BUN: 15 mg/dL (ref 6–23)
CO2: 26 mEq/L (ref 19–32)
Calcium: 9.5 mg/dL (ref 8.4–10.5)
Chloride: 105 mEq/L (ref 96–112)
Creatinine, Ser: 0.77 mg/dL (ref 0.40–1.20)
GFR: 102.54 mL/min (ref >60.00)
Glucose, Bld: 80 mg/dL (ref 70–99)
Potassium: 4.4 mEq/L (ref 3.5–5.1)
Sodium: 138 mEq/L (ref 135–145)

## 2018-10-29 LAB — FERRITIN: Ferritin: 4.9 ng/mL — ABNORMAL LOW (ref 10.0–291.0)

## 2018-10-29 LAB — IRON: Iron: 19 ug/dL — ABNORMAL LOW (ref 42–145)

## 2018-10-29 NOTE — Patient Instructions (Signed)
Please complete lab work prior to leaving.    Preventive Care 21-36 Years Old, Female Preventive care refers to visits with your health care provider and lifestyle choices that can promote health and wellness. This includes:  A yearly physical exam. This may also be called an annual well check.  Regular dental visits and eye exams.  Immunizations.  Screening for certain conditions.  Healthy lifestyle choices, such as eating a healthy diet, getting regular exercise, not using drugs or products that contain nicotine and tobacco, and limiting alcohol use. What can I expect for my preventive care visit? Physical exam Your health care provider will check your:  Height and weight. This may be used to calculate body mass index (BMI), which tells if you are at a healthy weight.  Heart rate and blood pressure.  Skin for abnormal spots. Counseling Your health care provider may ask you questions about your:  Alcohol, tobacco, and drug use.  Emotional well-being.  Home and relationship well-being.  Sexual activity.  Eating habits.  Work and work environment.  Method of birth control.  Menstrual cycle.  Pregnancy history. What immunizations do I need?  Influenza (flu) vaccine  This is recommended every year. Tetanus, diphtheria, and pertussis (Tdap) vaccine  You may need a Td booster every 10 years. Varicella (chickenpox) vaccine  You may need this if you have not been vaccinated. Human papillomavirus (HPV) vaccine  If recommended by your health care provider, you may need three doses over 6 months. Measles, mumps, and rubella (MMR) vaccine  You may need at least one dose of MMR. You may also need a second dose. Meningococcal conjugate (MenACWY) vaccine  One dose is recommended if you are age 19-21 years and a first-year college student living in a residence hall, or if you have one of several medical conditions. You may also need additional booster  doses. Pneumococcal conjugate (PCV13) vaccine  You may need this if you have certain conditions and were not previously vaccinated. Pneumococcal polysaccharide (PPSV23) vaccine  You may need one or two doses if you smoke cigarettes or if you have certain conditions. Hepatitis A vaccine  You may need this if you have certain conditions or if you travel or work in places where you may be exposed to hepatitis A. Hepatitis B vaccine  You may need this if you have certain conditions or if you travel or work in places where you may be exposed to hepatitis B. Haemophilus influenzae type b (Hib) vaccine  You may need this if you have certain conditions. You may receive vaccines as individual doses or as more than one vaccine together in one shot (combination vaccines). Talk with your health care provider about the risks and benefits of combination vaccines. What tests do I need?  Blood tests  Lipid and cholesterol levels. These may be checked every 5 years starting at age 20.  Hepatitis C test.  Hepatitis B test. Screening  Diabetes screening. This is done by checking your blood sugar (glucose) after you have not eaten for a while (fasting).  Sexually transmitted disease (STD) testing.  BRCA-related cancer screening. This may be done if you have a family history of breast, ovarian, tubal, or peritoneal cancers.  Pelvic exam and Pap test. This may be done every 3 years starting at age 21. Starting at age 30, this may be done every 5 years if you have a Pap test in combination with an HPV test. Talk with your health care provider about your test results,   treatment options, and if necessary, the need for more tests. Follow these instructions at home: Eating and drinking   Eat a diet that includes fresh fruits and vegetables, whole grains, lean protein, and low-fat dairy.  Take vitamin and mineral supplements as recommended by your health care provider.  Do not drink alcohol  if: ? Your health care provider tells you not to drink. ? You are pregnant, may be pregnant, or are planning to become pregnant.  If you drink alcohol: ? Limit how much you have to 0-1 drink a day. ? Be aware of how much alcohol is in your drink. In the [place] one drink equals one 12 oz bottle of beer (355 mL), one 5 oz glass of wine (148 mL), or one 1 oz glass of hard liquor (44 mL). Lifestyle  Take daily care of your teeth and gums.  Stay active. Exercise for at least 30 minutes on 5 or more days each week.  Do not use any products that contain nicotine or tobacco, such as cigarettes, e-cigarettes, and chewing tobacco. If you need help quitting, ask your health care provider.  If you are sexually active, practice safe sex. Use a condom or other form of birth control (contraception) in order to prevent pregnancy and STIs (sexually transmitted infections). If you plan to become pregnant, see your health care provider for a preconception visit. What's next?  Visit your health care provider once a year for a well check visit.  Ask your health care provider how often you should have your eyes and teeth checked.  Stay up to date on all vaccines. This information is not intended to replace advice given to you by your health care provider. Make sure you discuss any questions you have with your health care provider. Document Released: 03/04/2001 Document Revised: 09/17/2017 Document Reviewed: 09/17/2017 Elsevier Patient Education  2020 Elsevier Inc[employer]  

## 2018-10-29 NOTE — Progress Notes (Signed)
Subjective:    Patient ID: Emily Robbins, female    DOB: 23-Sep-1980, 38 y.o.   MRN: [id number]  HPI  Patient is a 38 yr old female who presents today for complete physical. Immunizations: tdap 2015 Diet:  Healthy Wt Readings from Last 3 Encounters:  10/29/18 150 lb 3.2 oz (68.1 kg)  04/09/18 151 lb (68.5 kg)  10/06/17 145 lb 9.6 oz (66 kg)  Exercise:  Walks 30 minutes every morning Pap Smear:  11/20/16    Review of Systems  Constitutional: Negative for unexpected weight change.  HENT: Negative for hearing loss and rhinorrhea.   Eyes: Negative for visual disturbance.  Respiratory: Negative for cough and shortness of breath.   Cardiovascular: Negative for chest pain and leg swelling.  Gastrointestinal: Negative for constipation and diarrhea.  Genitourinary: Positive for menstrual problem (heavy first 2 days). Negative for dysuria, frequency and hematuria.  Musculoskeletal: Negative for arthralgias and myalgias.  Skin: Negative for rash.  Neurological: Negative for headaches.  Hematological: Negative for adenopathy.  Psychiatric/Behavioral:       Grieving loss of 73 yr old cousin last week to covid-19   Past Medical History:  Diagnosis Date   H/O gastroesophageal reflux (GERD)    History of stomach ulcers      Social History   Socioeconomic History   Marital status: Single    Spouse name: Not on file   Number of children: Not on file   Years of education: Not on file   Highest education level: Not on file  Occupational History   Not on file  Social Needs   Financial resource strain: Not on file   Food insecurity    Worry: Not on file    Inability: Not on file   Transportation needs    Medical: Not on file    Non-medical: Not on file  Tobacco Use   Smoking status: Current Every Day Smoker    Packs/day: 0.50    Types: Cigarettes    Start date: 01/21/2000   Smokeless tobacco: Never Used  Substance and Sexual Activity   Alcohol use: Not on  file    Comment: occasional   Drug use: Not on file   Sexual activity: Yes    Partners: Female  Lifestyle   Physical activity    Days per week: Not on file    Minutes per session: Not on file   Stress: Not on file  Relationships   Social connections    Talks on phone: Not on file    Gets together: Not on file    Attends religious service: Not on file    Active member of club or organization: Not on file    Attends meetings of clubs or organizations: Not on file    Relationship status: Not on file   Intimate partner violence    Fear of current or ex partner: Not on file    Emotionally abused: Not on file    Physically abused: Not on file    Forced sexual activity: Not on file  Other Topics Concern   Not on file  Social History Narrative   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   Was raised by her grandmother   Lives with female partner    Past Surgical History:  Procedure Laterality Date   ESOPHAGOGASTRODUODENOSCOPY     to evaluate for ulcers   OTHER SURGICAL HISTORY  2015   boil  removed from buttock    Family History  Problem Relation Age of Onset   Hypertension Mother    Other Mother        borderline diabetes   Hypertension Father    Asthma Father    Hyperlipidemia Father    Hypertension Brother    Hypertension Maternal Grandmother    Diabetes Maternal Grandmother    Seizures Maternal Grandmother    Hypertension Brother     No Known Allergies  Current Outpatient Medications on File Prior to Visit  Medication Sig Dispense Refill   cetirizine (ZYRTEC) 10 MG tablet Take 1 tablet (10 mg total) by mouth daily. 30 tablet 5   pantoprazole (PROTONIX) 40 MG tablet Take 1 tablet (40 mg total) by mouth daily. 30 tablet 5   No current facility-administered medications on file prior to visit.     BP 124/74 (BP Location: Right Arm, Patient Position: Sitting, Cuff Size: Small)    Pulse 81     Temp 98.5 F (36.9 C) (Oral)    Resp 16    Ht 5\' 2"  (1.575 m)    Wt 150 lb 3.2 oz (68.1 kg)    SpO2 100%    BMI 27.47 kg/m        Objective:   Physical Exam  Physical Exam  Constitutional: She is oriented to person, place, and time. She appears well-developed and well-nourished. No distress.  HENT:  Head: Normocephalic and atraumatic.  Right Ear: Tympanic membrane and ear canal normal.  Left Ear: Tympanic membrane and ear canal normal.  Mouth/Throat: not examined. Pt wearing mask for covid-19 precautions Eyes: Pupils are equal, round, and reactive to light. No scleral icterus.  Neck: Normal range of motion. No thyromegaly present.  Cardiovascular: Normal rate and regular rhythm.   No murmur heard. Pulmonary/Chest: Effort normal and breath sounds normal. No respiratory distress. He has no wheezes. She has no rales. She exhibits no tenderness.  Abdominal: Soft. Bowel sounds are normal. She exhibits no distension and no mass. There is no tenderness. There is no rebound and no guarding.  Musculoskeletal: She exhibits no edema.  Lymphadenopathy:    She has no cervical adenopathy.  Neurological: She is alert and oriented to person, place, and time. She has normal patellar reflexes. She exhibits normal muscle tone. Coordination normal.  Skin: Skin is warm and dry.  Psychiatric: She has a normal mood and affect. Her behavior is normal. Judgment and thought content normal.  Breasts: Examined lying Right: Without masses, retractions, discharge or axillary adenopathy.  Left: Without masses, retractions, discharge or axillary adenopathy.  Pelvic: deferred         Assessment & Plan:  Preventative care- encouraged pt to continue healthy diet and regular exercise. Declines flu shot. Tetanus up to date. Pap up to date. Obtain routine lab work.   Uterine Fibroid- would like referral to GYN. Referral placed.        Assessment & Plan:

## 2018-11-01 ENCOUNTER — Encounter: Payer: Self-pay | Admitting: Family

## 2018-11-01 ENCOUNTER — Telehealth: Payer: Self-pay | Admitting: Family

## 2018-11-01 DIAGNOSIS — D500 Iron deficiency anemia secondary to blood loss (chronic): Secondary | ICD-10-CM

## 2018-11-01 DIAGNOSIS — D509 Iron deficiency anemia, unspecified: Secondary | ICD-10-CM | POA: Insufficient documentation

## 2018-11-01 HISTORY — DX: Iron deficiency anemia, unspecified: D50.9

## 2018-11-01 MED ORDER — FERROUS SULFATE 325 (65 FE) MG PO TABS: 325.0000 mg | ORAL_TABLET | Freq: Two times a day (BID) | ORAL | 3 refills | Status: DC

## 2018-11-01 NOTE — Telephone Encounter (Signed)
Called but no answer and no voicemail set up. Will try later

## 2018-11-01 NOTE — Telephone Encounter (Signed)
Lab work shows iron is low. I would like her to add otc iron 325mg  bid and repeat CBC, serum iron, ferritin in 3 months. Dx iron def anemia.

## 2018-11-02 ENCOUNTER — Other Ambulatory Visit: Payer: Self-pay

## 2018-11-02 DIAGNOSIS — D509 Iron deficiency anemia, unspecified: Secondary | ICD-10-CM

## 2018-11-02 NOTE — Telephone Encounter (Signed)
Advised patient of results and to start iron 325 bid. She will call back to set up 3 months labs. Orders for labs entered.

## 2018-11-03 ENCOUNTER — Encounter: Payer: Self-pay | Admitting: Family

## 2018-11-26 ENCOUNTER — Encounter: Payer: Self-pay | Admitting: Family

## 2018-11-29 NOTE — Telephone Encounter (Signed)
Patient was scheduled for a virtual visit 12-01-18

## 2018-11-30 ENCOUNTER — Encounter: Payer: Self-pay | Admitting: Family

## 2018-12-01 ENCOUNTER — Other Ambulatory Visit: Payer: Self-pay

## 2018-12-01 ENCOUNTER — Ambulatory Visit (INDEPENDENT_AMBULATORY_CARE_PROVIDER_SITE_OTHER): Payer: BC Managed Care – PPO | Admitting: Family

## 2018-12-01 DIAGNOSIS — Z716 Tobacco abuse counseling: Secondary | ICD-10-CM

## 2018-12-01 MED ORDER — CHANTIX STARTING MONTH PAK 0.5 MG X 11 & 1 MG X 42 PO TABS: ORAL_TABLET | ORAL | 0 refills | Status: DC

## 2018-12-01 NOTE — Progress Notes (Signed)
Virtual Visit via Video Note  I connected with Gwenette Churchfield Hribar on 12/01/18 at 10:40 AM EST by a video enabled telemedicine application and verified that I am speaking with the correct person using two identifiers.  Location: Patient: home Provider: home   I discussed the limitations of evaluation and management by telemedicine and the availability of in person appointments. The patient expressed understanding and agreed to proceed.  History of Present Illness:  Patient is a 38 yr old female who presents today to discuss smoking cessation.  She is smoking 10 cigarettes a day.  Started smoking at age [age].  Was able to quit for about 6 weeks previously with the patch.  States her insurance is wanting her to get smoking cessation counseling.  She is motivated to quit.  Past Medical History:  Diagnosis Date  . H/O gastroesophageal reflux (GERD)   . History of stomach ulcers   . Iron deficiency anemia 11/01/2018     Social History   Socioeconomic History  . Marital status: Single    Spouse name: Not on file  . Number of children: Not on file  . Years of education: Not on file  . Highest education level: Not on file  Occupational History  . Not on file  Social Needs  . Financial resource strain: Not on file  . Food insecurity    Worry: Not on file    Inability: Not on file  . Transportation needs    Medical: Not on file    Non-medical: Not on file  Tobacco Use  . Smoking status: Current Every Day Smoker    Packs/day: 0.50    Types: Cigarettes    Start date: 01/21/2000  . Smokeless tobacco: Never Used  Substance and Sexual Activity  . Alcohol use: Not on file    Comment: occasional  . Drug use: Not on file  . Sexual activity: Yes    Partners: Female  Lifestyle  . Physical activity    Days per week: Not on file    Minutes per session: Not on file  . Stress: Not on file  Relationships  . Social Herbalist on phone: Not on file    Gets together: Not on  file    Attends religious service: Not on file    Active member of club or organization: Not on file    Attends meetings of clubs or organizations: Not on file    Relationship status: Not on file  . Intimate partner violence    Fear of current or ex partner: Not on file    Emotionally abused: Not on file    Physically abused: Not on file    Forced sexual activity: Not on file  Other Topics Concern  . 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 ESOPHAGOGASTRODUODENOSCOPY     to evaluate for ulcers  . OTHER SURGICAL HISTORY  2015   boil removed from buttock    Family History  Problem Relation Age of Onset  . Hypertension Mother   . Other Mother        borderline diabetes  . Hypertension Father   . Asthma Father   . Hyperlipidemia Father   . Hypertension Brother   . Hypertension  Maternal Grandmother   . Diabetes Maternal Grandmother   . Seizures Maternal Grandmother   . Hypertension Brother     No Known Allergies  Current Outpatient Medications on File Prior to Visit  Medication Sig Dispense Refill  . cetirizine (ZYRTEC) 10 MG tablet Take 1 tablet (10 mg total) by mouth daily. 30 tablet 5  . ferrous sulfate 325 (65 FE) MG tablet Take 1 tablet (325 mg total) by mouth 2 (two) times daily with a meal.  3  . pantoprazole (PROTONIX) 40 MG tablet Take 1 tablet (40 mg total) by mouth daily. 30 tablet 5   No current facility-administered medications on file prior to visit.     There were no vitals taken for this visit.    Observations/Objective:   Gen: Awake, alert, no acute distress Resp: Breathing is even and non-labored Psych: calm/pleasant demeanor Neuro: Alert and Oriented x 3, + facial symmetry, speech is clear.   Assessment and Plan:  Tobacco abuse  counseling- Discussed options including nicotine patch, wellbutrin and chantix. She opted for a trial of chantix. Common side effects including rare risk of suicide ideation was discussed with the patient today.  Patient is instructed to go directly to the ED if this occurs.  We discussed that patient can continue to smoke for 1 week after starting chantix, but then must discontinue cigarettes.  She is also instructed to contact us prior to completion of the starter month pack for an update on her progress and an rx for the continuation month pack.   9 minutes spent with patient today on tobacco cessation counseling.    Follow Up Instructions:    I discussed the assessment and treatment plan with the patient. The patient was provided an opportunity to ask questions and all were answered. The patient agreed with the plan and demonstrated an understanding of the instructions.   The patient was advised to call back or seek an in-person evaluation if the symptoms worsen or if the condition fails to improve as anticipated.  Nance Pear, NP

## 2018-12-09 ENCOUNTER — Encounter: Payer: Self-pay | Admitting: Obstetrics & Gynecology

## 2018-12-09 ENCOUNTER — Other Ambulatory Visit: Payer: Self-pay

## 2018-12-09 ENCOUNTER — Ambulatory Visit (INDEPENDENT_AMBULATORY_CARE_PROVIDER_SITE_OTHER): Payer: BC Managed Care – PPO | Admitting: Obstetrics & Gynecology

## 2018-12-09 VITALS — BP 128/81 | HR 79 | Ht 62.0 in | Wt 147.0 lb

## 2018-12-09 DIAGNOSIS — N920 Excessive and frequent menstruation with regular cycle: Secondary | ICD-10-CM

## 2018-12-09 DIAGNOSIS — Z124 Encounter for screening for malignant neoplasm of cervix: Secondary | ICD-10-CM | POA: Diagnosis not present

## 2018-12-09 DIAGNOSIS — D500 Iron deficiency anemia secondary to blood loss (chronic): Secondary | ICD-10-CM

## 2018-12-09 DIAGNOSIS — Z01419 Encounter for gynecological examination (general) (routine) without abnormal findings: Secondary | ICD-10-CM

## 2018-12-09 DIAGNOSIS — Z1151 Encounter for screening for human papillomavirus (HPV): Secondary | ICD-10-CM | POA: Diagnosis not present

## 2018-12-09 DIAGNOSIS — D219 Benign neoplasm of connective and other soft tissue, unspecified: Secondary | ICD-10-CM

## 2018-12-09 MED ORDER — TRANEXAMIC ACID 650 MG PO TABS: 1300.0000 mg | ORAL_TABLET | Freq: Three times a day (TID) | ORAL | 3 refills | Status: DC

## 2018-12-09 MED ORDER — INTEGRA F 125-1 MG PO CAPS: 1.0000 | ORAL_CAPSULE | Freq: Every day | ORAL | 6 refills | Status: DC

## 2018-12-09 NOTE — Patient Instructions (Signed)

## 2018-12-09 NOTE — Progress Notes (Signed)
Patient states her period is very heavy the first two days of her period. Patient also states she gets very cold. Kathrene Alu RN

## 2018-12-09 NOTE — Progress Notes (Signed)
Subjective:     Emily Robbins is a 38 y.o. female here for a routine exam.G0 Pt reports monthly cycles. She reports heavy menses for 2-3 of the 5 day cycles. She reports that on the heavy days she bleeds through the pads or tampons and ruins her clothes. She reports heavy bleeding a t night as well. She was dx'd with iron def anemia however, she is unable to tol the supplements due to GI upset. Current complaints: mass palpated in her abd. US revealed uterine fibroids.  Pt is not interested in getting pregnant in the future.     Gynecologic History No LMP recorded. (Menstrual status: Irregular Periods). Contraception: none. Same sex relationship Last Pap: unknown. H/o abnormal PAP years prev.  Last mammogram: n/a  Obstetric History OB History  No obstetric history on file.   The following portions of the patient's history were reviewed and updated as appropriate: allergies, current medications, past family history, past medical history, past social history, past surgical history and problem list.  Review of Systems Pertinent items are noted in HPI.    Objective:  BP 128/81   Pulse 79   Ht 5\' 2"  (1.575 m)   Wt 147 lb (66.7 kg)   BMI 26.89 kg/m  General Appearance:    Alert, cooperative, no distress, appears stated age  Head:    Normocephalic, without obvious abnormality, atraumatic  Eyes:    conjunctiva/corneas clear, EOM's intact, both eyes  Ears:    Normal external ear canals, both ears  Nose:   Nares normal, septum midline, mucosa normal, no drainage    or sinus tenderness  Throat:   Lips, mucosa, and tongue normal; teeth and gums normal  Neck:   Supple, symmetrical, trachea midline, no adenopathy;    thyroid:  no enlargement/tenderness/nodules  Back:     Symmetric, no curvature, ROM normal, no CVA tenderness  Lungs:     respirations unlabored  Chest Wall:    No tenderness or deformity   Heart:    Regular rate and rhythm  Breast Exam:    No tenderness, masses, or  nipple abnormality  Abdomen:     Soft, non-tender, bowel sounds active all four quadrants,    no masses, no organomegaly  Genitalia:    Normal female without lesion, discharge or tenderness   Uterus enlarged due to fibroids. ~14x10 cm. 14-16 weeks sized. Mobile.     Extremities:   Extremities normal, atraumatic, no cyanosis or edema  Pulses:   2+ and symmetric all extremities  Skin:   Skin color, texture, turgor normal, no rashes or lesions; tattoos on arms and behind her ear on left side.     CBC Latest Ref Rng & Units 10/29/2018 10/06/2017  WBC 4.0 - 10.5 K/uL 8.2 8.7  Hemoglobin 12.0 - 15.0 g/dL 10.6(L) 12.2  Hematocrit 36.0 - 46.0 % 34.0(L) 38.2  Platelets 150.0 - 400.0 K/uL 394.0 371.0   Assessment:    Healthy female exam.   Fibroid uterus- symptomatic. Pt is concerned about surgery as her partner has a complicated hysterectomy in 02/2018. She wants to try conservative measures to control the heavy bleeding before attempting other measures.  Reviewed all tx options.  Anemia due to chronic blood loss.    Plan:   Diagnoses and all orders for this visit:  Well woman exam with routine gynecological exam -     Cytology - PAP( [place])  Iron deficiency anemia due to chronic blood loss -     Fe  Fum-FePoly-FA-Vit C-Vit B3 (INTEGRA F) 125-1 MG CAPS; Take 1 capsule by mouth daily. -     tranexamic acid (LYSTEDA) 650 MG TABS tablet; Take 2 tablets (1,300 mg total) by mouth 3 (three) times daily. Take during menses for a maximum of five days  Fibroids -     Fe Fum-FePoly-FA-Vit C-Vit B3 (INTEGRA F) 125-1 MG CAPS; Take 1 capsule by mouth daily. -     tranexamic acid (LYSTEDA) 650 MG TABS tablet; Take 2 tablets (1,300 mg total) by mouth 3 (three) times daily. Take during menses for a maximum of five days  Menorrhagia with regular cycle -     Fe Fum-FePoly-FA-Vit C-Vit B3 (INTEGRA F) 125-1 MG CAPS; Take 1 capsule by mouth daily. -     tranexamic acid (LYSTEDA) 650 MG TABS tablet; Take 2  tablets (1,300 mg total) by mouth 3 (three) times daily. Take during menses for a maximum of five days  f/u in 3 months or sooner prn  Taite Baldassari L. Harraway-Smith, M.D., Cherlynn June

## 2018-12-10 LAB — CYTOLOGY - PAP
Comment: NEGATIVE
Diagnosis: NEGATIVE
High risk HPV: NEGATIVE

## 2018-12-24 ENCOUNTER — Other Ambulatory Visit: Payer: Self-pay | Admitting: Obstetrics & Gynecology

## 2019-04-25 ENCOUNTER — Encounter: Payer: Self-pay | Admitting: Family

## 2019-04-26 NOTE — Telephone Encounter (Signed)
Scheduled for nv tb test tomorrow

## 2019-04-27 ENCOUNTER — Other Ambulatory Visit: Payer: Self-pay

## 2019-04-27 ENCOUNTER — Ambulatory Visit (INDEPENDENT_AMBULATORY_CARE_PROVIDER_SITE_OTHER): Payer: BC Managed Care – PPO

## 2019-04-27 DIAGNOSIS — Z111 Encounter for screening for respiratory tuberculosis: Secondary | ICD-10-CM

## 2019-04-27 NOTE — Progress Notes (Signed)
Patient here for ppd screening test. She also had hearing and vision screening. This are all required by [employer].  She will return Friday for tb skin test reading.

## 2019-04-29 LAB — TB SKIN TEST
Induration: NEGATIVE mm
TB Skin Test: NEGATIVE

## 2019-05-11 ENCOUNTER — Encounter: Payer: Self-pay | Admitting: Family

## 2019-05-12 MED ORDER — CETIRIZINE HCL 10 MG PO TABS: 10.0000 mg | ORAL_TABLET | Freq: Every day | ORAL | 5 refills | Status: DC

## 2019-06-19 IMAGING — US US PELVIS COMPLETE WITH TRANSVAGINAL
1 series · 14 of 25 positions shown · non-contrast
Comparison: None

CLINICAL DATA: Suprapubic mass.

EXAM:
TRANSABDOMINAL AND TRANSVAGINAL ULTRASOUND OF PELVIS
TECHNIQUE: Both transabdominal and transvaginal ultrasound examinations of the
pelvis were performed. Transabdominal technique was performed for
global imaging of the pelvis including uterus, ovaries, adnexal
regions, and pelvic cul-de-sac. It was necessary to proceed with
endovaginal exam following the transabdominal exam to visualize the
endometrium and ovaries.

[Series 1: us pelvis complete with transvaginal · 14 of 59 slices shown]
[im 1/59]
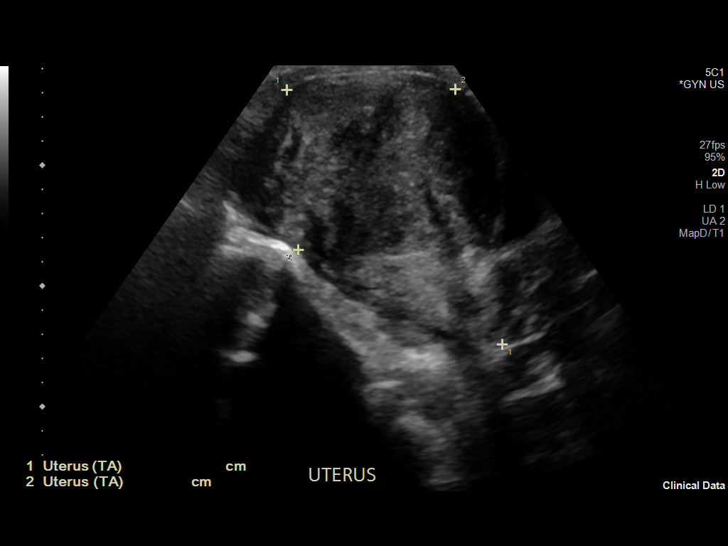
[im 5/59]
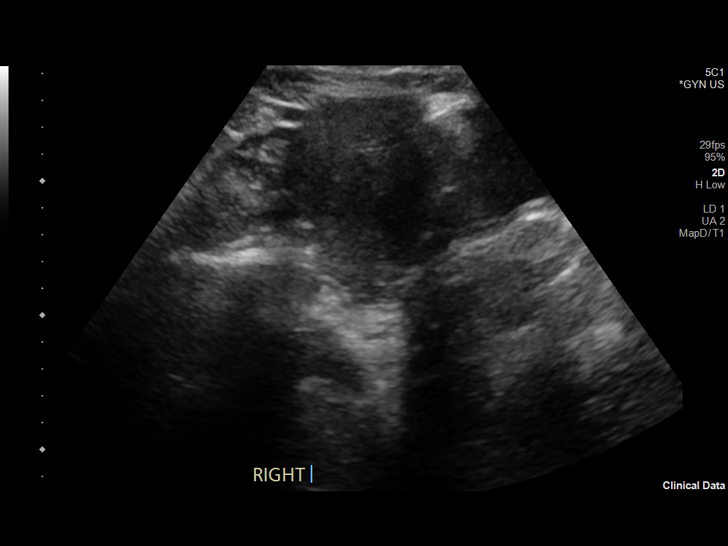
[im 10/59]
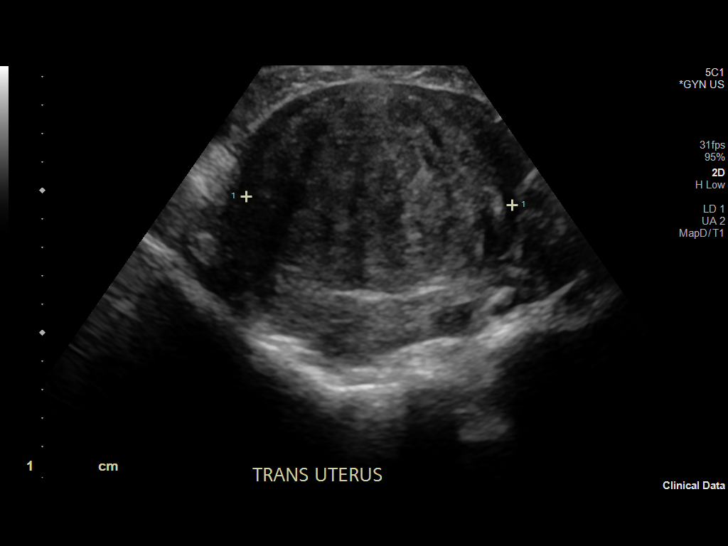
[im 15/59]
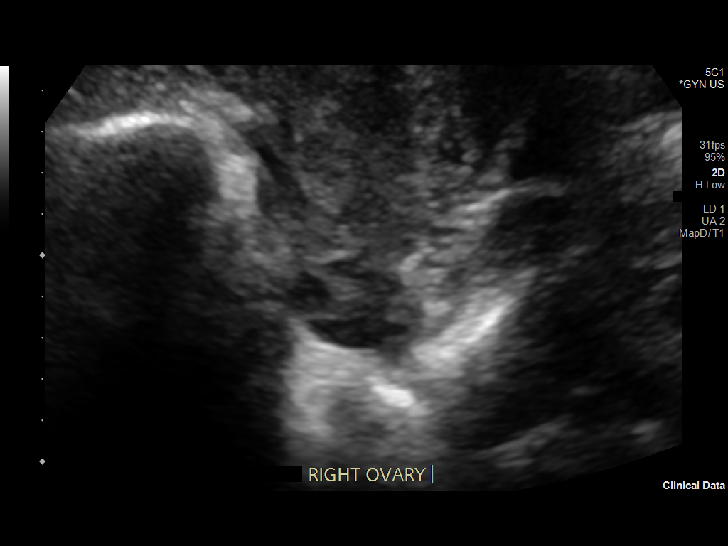
[im 20/59]
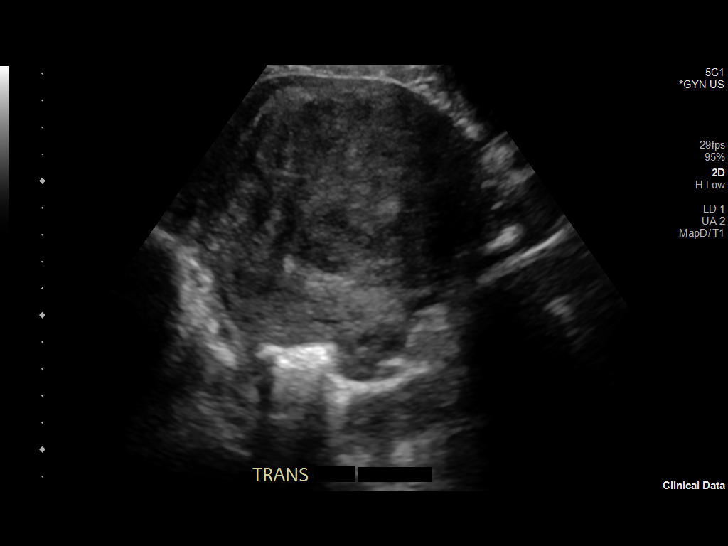
[im 22/59]
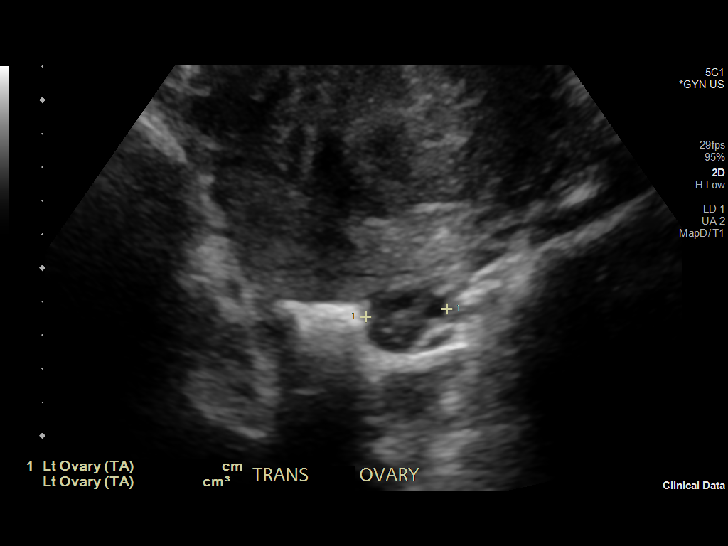
[im 27/59]
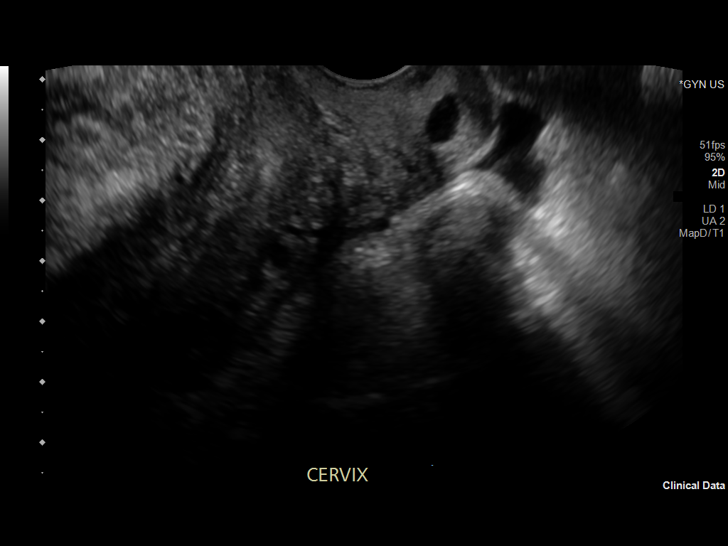
[im 32/59]
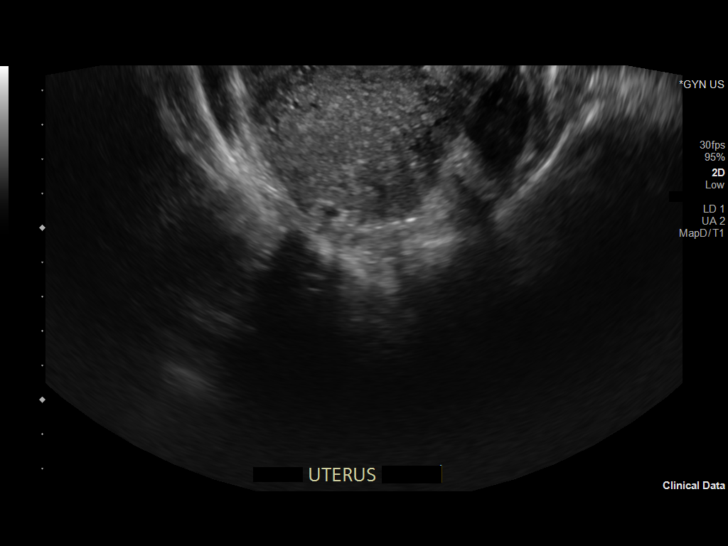
[im 37/59]
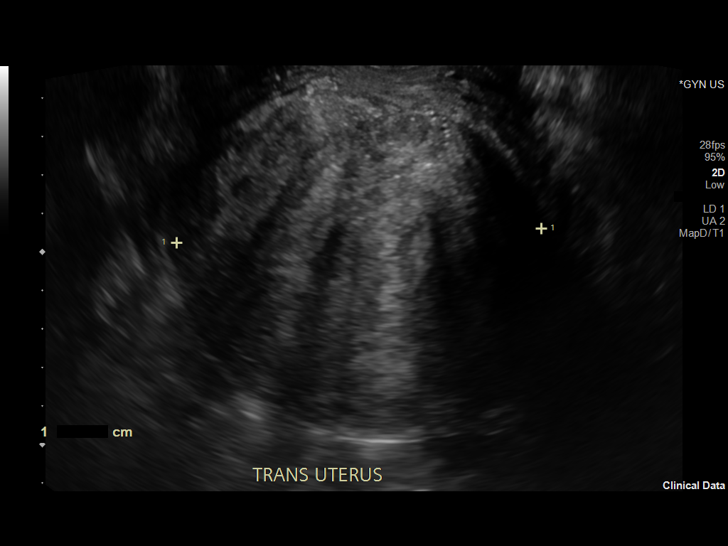
[im 39/59]
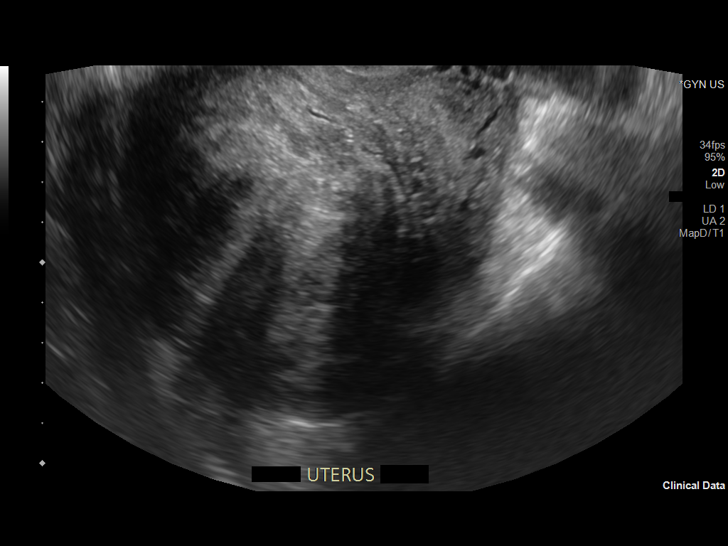
[im 44/59]
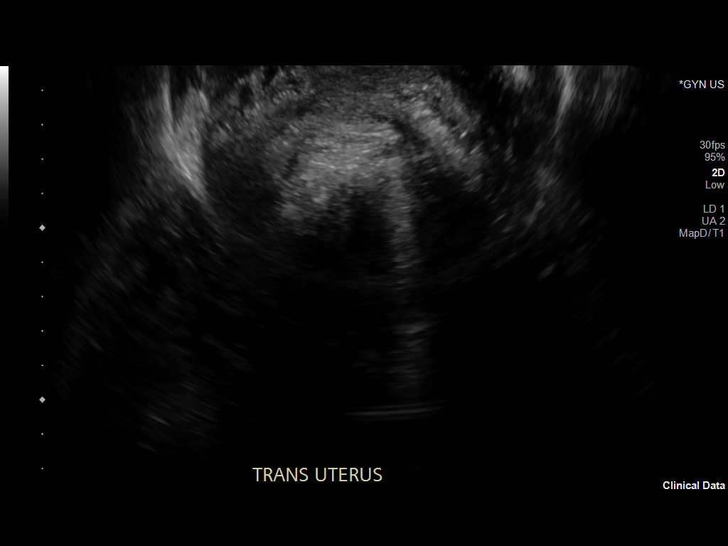
[im 49/59]
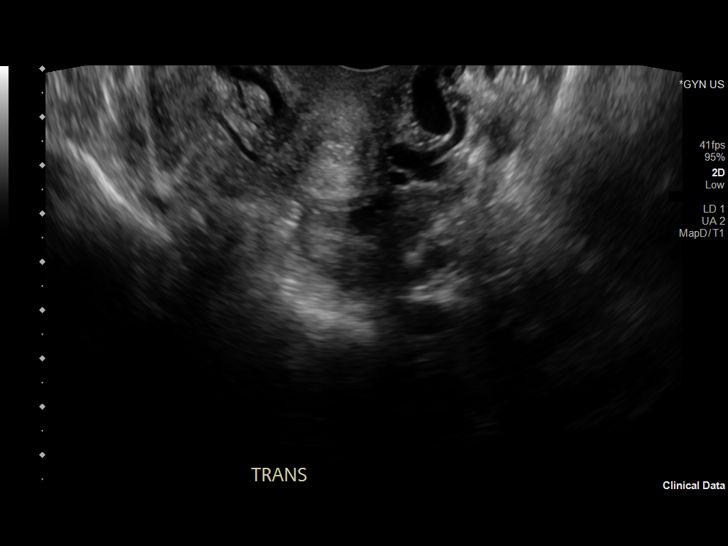
[im 54/59]
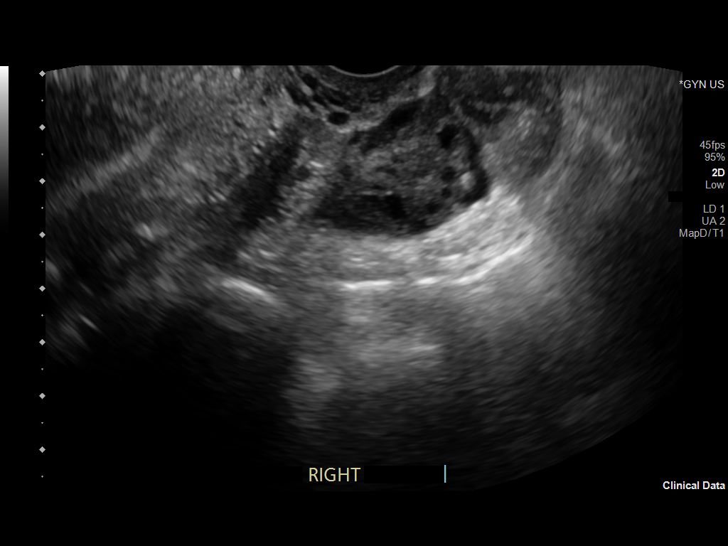
[im 59/59]
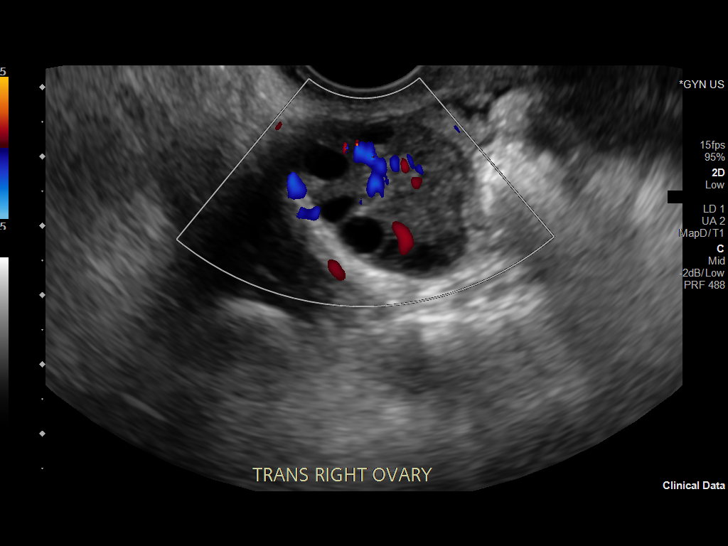

[14 of 25 positions shown; findings below may reference images not displayed]

FINDINGS: Uterus

Measurements: 13.8 x 9.3 x 10.3 cm = volume: 696 mL. There is a
large fibroid off the anterior left uterine fundus measuring 9.3 x
8.7 x 9.5 cm.

Endometrium

Thickness: 11 mm.  No focal abnormality visualized.

Right ovary

Measurements: 3.0 x 2.1 x 2.7 cm = volume: 8.9 mL. Normal
appearance/no adnexal mass.

Left ovary

Measurements: 3.3 x 1.8 x 3.0 cm = volume: 8.8 mL. Normal
appearance/no adnexal mass.

Other findings

Trace fluid in the cul-de-sac, physiologic.
IMPRESSION: 1. There is a large fibroid off the anterior left uterine fundus,
likely accounting for the palpable suprapubic mass. No other
abnormalities identified.

## 2019-07-12 ENCOUNTER — Encounter: Payer: Self-pay | Admitting: Family

## 2019-07-19 ENCOUNTER — Other Ambulatory Visit: Payer: Self-pay

## 2019-07-19 ENCOUNTER — Encounter: Payer: Self-pay | Admitting: Family

## 2019-07-19 ENCOUNTER — Ambulatory Visit: Payer: BC Managed Care – PPO | Admitting: Family

## 2019-07-19 VITALS — BP 114/71 | HR 88 | Temp 97.6°F | Resp 16 | Ht 62.0 in | Wt 141.0 lb

## 2019-07-19 DIAGNOSIS — Z716 Tobacco abuse counseling: Secondary | ICD-10-CM | POA: Diagnosis not present

## 2019-07-19 MED ORDER — CHANTIX STARTING MONTH PAK 0.5 MG X 11 & 1 MG X 42 PO TABS: ORAL_TABLET | ORAL | 0 refills | Status: DC

## 2019-07-19 NOTE — Progress Notes (Signed)
Subjective:    Patient ID: Emily Robbins, female    DOB: November 18, 1980, 39 y.o.   MRN: [id number]  HPI   Patient is a 39 yr old female who presents today to discuss smoking cessation. She states that she never started the chantix after last visit. She is now motivated to quit.  Smoking almost 1PPD. She started smoking at age 13.  Review of Systems    see HPI  Past Medical History:  Diagnosis Date  . H/O gastroesophageal reflux (GERD)   . History of stomach ulcers   . Iron deficiency anemia 11/01/2018     Social History   Socioeconomic History  . Marital status: Single    Spouse name: Not on file  . Number of children: Not on file  . Years of education: Not on file  . Highest education level: Not on file  Occupational History  . Not on file  Tobacco Use  . Smoking status: Current Every Day Smoker    Packs/day: 0.50    Types: Cigarettes    Start date: 01/21/2000  . Smokeless tobacco: Never Used  Vaping Use  . Vaping Use: Former  Substance and Sexual Activity  . Alcohol use: Not on file    Comment: occasional  . Drug use: Not on file  . Sexual activity: Yes    Partners: Female  Other Topics Concern  . 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 Difficulty of Paying Living Expenses:   Food Insecurity:   . Worried About Charity fundraiser in the Last Year:   . Arboriculturist in the Last Year:   Transportation Needs:   . Film/video editor (Medical):   Marland Kitchen Lack of Transportation (Non-Medical):   Physical Activity:   . Days of Exercise per Week:   . Minutes of Exercise per Session:   Stress:   . Feeling of Stress :   Social Connections:   . Frequency of Communication with Friends and Family:   . Frequency of Social Gatherings with  Friends and Family:   . Attends Religious Services:   . Active Member of Clubs or Organizations:   . Attends Archivist Meetings:   Marland Kitchen Marital Status:   Intimate Partner Violence:   . Fear of Current or Ex-Partner:   . Emotionally Abused:   Marland Kitchen Physically Abused:   . Sexually Abused:     Past Surgical History:  Procedure Laterality Date  . ESOPHAGOGASTRODUODENOSCOPY     to evaluate for ulcers  . OTHER SURGICAL HISTORY  2015   boil removed from buttock    Family History  Problem Relation Age of Onset  . Hypertension Mother   . Other Mother        borderline diabetes  . Hypertension Father   . Asthma Father   . Hyperlipidemia Father   . Hypertension Brother   . Hypertension Maternal Grandmother   . Diabetes Maternal Grandmother   . Seizures Maternal Grandmother   . Hypertension Brother     No Known Allergies  Current Outpatient Medications on File Prior to Visit  Medication Sig Dispense Refill  . cetirizine (ZYRTEC) 10 MG tablet Take 1 tablet (10 mg total) by mouth daily. [place]  tablet 5  . Fe Fum-FePoly-FA-Vit C-Vit B3 (INTEGRA F) 125-1 MG CAPS Take 1 capsule by mouth daily. 30 capsule 6  . ferrous sulfate 325 (65 FE) MG tablet Take 1 tablet (325 mg total) by mouth 2 (two) times daily with a meal.  3  . pantoprazole (PROTONIX) 40 MG tablet Take 1 tablet (40 mg total) by mouth daily. 30 tablet 5  . tranexamic acid (LYSTEDA) 650 MG TABS tablet Take 2 tablets (1,300 mg total) by mouth 3 (three) times daily. Take during menses for a maximum of five days 30 tablet 3   No current facility-administered medications on file prior to visit.    BP 114/71 (BP Location: Right Arm, Patient Position: Sitting, Cuff Size: Small)   Pulse 88   Temp 97.6 F (36.4 C) (Temporal)   Resp 16   Ht 5\' 2"  (1.575 m)   Wt 141 lb (64 kg)   SpO2 100%   BMI 25.79 kg/m    Objective:   Physical Exam Constitutional:      Appearance: Normal appearance.  Neurological:     Mental Status:  She is alert.  Psychiatric:        Attention and Perception: Attention normal.        Mood and Affect: Mood normal.        Speech: Speech normal.        Behavior: Behavior normal. Behavior is cooperative.        Cognition and Memory: Cognition normal.           Assessment & Plan:  Tobacco abuse- she is willing to try chantix.    Trial of chantix. Common side effects including rare risk of suicide ideation was discussed with the patient today.  Patient is instructed to go directly to the ED if this occurs.  We discussed that patient can continue to smoke for 1 week after starting chantix, but then must discontinue cigarettes.  He is also instructed to contact us prior to completion of the starter month pack for an rx for the continuation month pack.    15 minutes spent on today's visit.  Time was spent on chart review and tobacco cessation counseling.

## 2019-08-09 ENCOUNTER — Ambulatory Visit: Payer: BC Managed Care – PPO | Admitting: Family

## 2019-08-09 ENCOUNTER — Encounter: Payer: Self-pay | Admitting: Family

## 2019-08-09 DIAGNOSIS — Z0289 Encounter for other administrative examinations: Secondary | ICD-10-CM

## 2019-08-10 NOTE — Telephone Encounter (Signed)
Please waive no-show fee.

## 2019-10-24 ENCOUNTER — Ambulatory Visit: Payer: BC Managed Care – PPO | Admitting: Family

## 2019-12-05 ENCOUNTER — Encounter: Payer: Self-pay | Admitting: Family

## 2019-12-06 ENCOUNTER — Telehealth: Payer: Self-pay | Admitting: Family

## 2019-12-06 NOTE — Telephone Encounter (Signed)
Medication:  cetirizine (ZYRTEC) 10 MG tablet [[id number]]   Has the patient contacted their pharmacy? No. (If no, request that the patient contact the pharmacy for the refill.) (If yes, when and what did the pharmacy advise?)  Preferred Pharmacy (with phone number or street name): [id number] [place], [place]  [address] [place] [address]  Phone:  [phone] Fax:  [phone]   Agent: Please be advised that RX refills may take up to 3 business days. We ask that you follow-up with your pharmacy.

## 2019-12-07 MED ORDER — CETIRIZINE HCL 10 MG PO TABS: 10.0000 mg | ORAL_TABLET | Freq: Every day | ORAL | 5 refills | Status: DC

## 2019-12-07 NOTE — Telephone Encounter (Signed)
Refill sent.

## 2019-12-07 NOTE — Addendum Note (Signed)
Addended by: Jiles Prows on: 12/07/2019 11:10 AM   Modules accepted: Orders

## 2019-12-13 ENCOUNTER — Other Ambulatory Visit: Payer: Self-pay

## 2019-12-13 ENCOUNTER — Encounter: Payer: Self-pay | Admitting: Family

## 2019-12-13 ENCOUNTER — Ambulatory Visit: Payer: BC Managed Care – PPO | Admitting: Family

## 2019-12-13 VITALS — BP 124/71 | HR 88 | Temp 98.6°F | Resp 16 | Ht 62.0 in | Wt 141.0 lb

## 2019-12-13 DIAGNOSIS — N920 Excessive and frequent menstruation with regular cycle: Secondary | ICD-10-CM | POA: Diagnosis not present

## 2019-12-13 DIAGNOSIS — Z720 Tobacco use: Secondary | ICD-10-CM

## 2019-12-13 DIAGNOSIS — R5383 Other fatigue: Secondary | ICD-10-CM

## 2019-12-13 DIAGNOSIS — D500 Iron deficiency anemia secondary to blood loss (chronic): Secondary | ICD-10-CM

## 2019-12-13 NOTE — Patient Instructions (Signed)
Please complete lab work prior to leaving. You can try nicotine gum as needed to help with your cravings.  Steps to Quit Smoking Smoking tobacco is the leading cause of preventable death. It can affect almost every organ in the body. Smoking puts you and those around you at risk for developing many serious chronic diseases. Quitting smoking can be difficult, but it is one of the best things that you can do for your health. It is never too late to quit. How do I get ready to quit? When you decide to quit smoking, create a plan to help you succeed. Before you quit:  Pick a date to quit. Set a date within the next 2 weeks to give you time to prepare.  Write down the reasons why you are quitting. Keep this list in places where you will see it often.  Tell your family, friends, and co-workers that you are quitting. Support from your loved ones can make quitting easier.  Talk with your health care provider about your options for quitting smoking.  Find out what treatment options are covered by your health insurance.  Identify people, places, things, and activities that make you want to smoke (triggers). Avoid them. What first steps can I take to quit smoking?  Throw away all cigarettes at home, at work, and in your car.  Throw away smoking accessories, such as Scientist, research (medical).  Clean your car. Make sure to empty the ashtray.  Clean your home, including curtains and carpets. What strategies can I use to quit smoking? Talk with your health care provider about combining strategies, such as taking medicines while you are also receiving in-person counseling. Using these two strategies together makes you more likely to succeed in quitting than if you used either strategy on its own.  If you are pregnant or breastfeeding, talk with your health care provider about finding counseling or other support strategies to quit smoking. Do not take medicine to help you quit smoking unless your health  care provider tells you to do so. To quit smoking: Quit right away  Quit smoking completely, instead of gradually reducing how much you smoke over a period of time. Research shows that stopping smoking right away is more successful than gradually quitting.  Attend in-person counseling to help you build problem-solving skills. You are more likely to succeed in quitting if you attend counseling sessions regularly. Even short sessions of 10 minutes can be effective. Take medicine You may take medicines to help you quit smoking. Some medicines require a prescription and some you can purchase over-the-counter. Medicines may have nicotine in them to replace the nicotine in cigarettes. Medicines may:  Help to stop cravings.  Help to relieve withdrawal symptoms. Your health care provider may recommend:  Nicotine patches, gum, or lozenges.  Nicotine inhalers or sprays.  Non-nicotine medicine that is taken by mouth. Find resources Find resources and support systems that can help you to quit smoking and remain smoke-free after you quit. These resources are most helpful when you use them often. They include:  Online chats with a Social worker.  Telephone quitlines.  Printed Furniture conservator/restorer.  Support groups or group counseling.  Text messaging programs.  Mobile phone apps or applications. Use apps that can help you stick to your quit plan by providing reminders, tips, and encouragement. There are many free apps for mobile devices as well as websites. Examples include Quit Guide from the [employer] and smokefree.gov What things can I do to make it easier  to quit?   Reach out to your family and friends for support and encouragement. Call telephone quitlines (1-800-QUIT-NOW), reach out to support groups, or work with a counselor for support.  Ask people who smoke to avoid smoking around you.  Avoid places that trigger you to smoke, such as bars, parties, or smoke-break areas at work.  Spend time  with people who do not smoke.  Lessen the stress in your life. Stress can be a smoking trigger for some people. To lessen stress, try: ? Exercising regularly. ? Doing deep-breathing exercises. ? Doing yoga. ? Meditating. ? Performing a body scan. This involves closing your eyes, scanning your body from head to toe, and noticing which parts of your body are particularly tense. Try to relax the muscles in those areas. How will I feel when I quit smoking? Day 1 to 3 weeks Within the first 24 hours of quitting smoking, you may start to feel withdrawal symptoms. These symptoms are usually most noticeable 2-3 days after quitting, but they usually do not last for more than 2-3 weeks. You may experience these symptoms:  Mood swings.  Restlessness, anxiety, or irritability.  Trouble concentrating.  Dizziness.  Strong cravings for sugary foods and nicotine.  Mild weight gain.  Constipation.  Nausea.  Coughing or a sore throat.  Changes in how the medicines that you take for unrelated issues work in your body.  Depression.  Trouble sleeping (insomnia). Week 3 and afterward After the first 2-3 weeks of quitting, you may start to notice more positive results, such as:  Improved sense of smell and taste.  Decreased coughing and sore throat.  Slower heart rate.  Lower blood pressure.  Clearer skin.  The ability to breathe more easily.  Fewer sick days. Quitting smoking can be very challenging. Do not get discouraged if you are not successful the first time. Some people need to make many attempts to quit before they achieve long-term success. Do your best to stick to your quit plan, and talk with your health care provider if you have any questions or concerns. Summary  Smoking tobacco is the leading cause of preventable death. Quitting smoking is one of the best things that you can do for your health.  When you decide to quit smoking, create a plan to help you succeed.  Quit  smoking right away, not slowly over a period of time.  When you start quitting, seek help from your health care provider, family, or friends. This information is not intended to replace advice given to you by your health care provider. Make sure you discuss any questions you have with your health care provider. Document Revised: 10/01/2018 Document Reviewed: 03/27/2018 Elsevier Patient Education  [place].

## 2019-12-13 NOTE — Progress Notes (Signed)
Subjective:    Patient ID: Emily Robbins, female    DOB: Jun 24, 1980, 39 y.o.   MRN: [id number]  HPI  Patient is a 39 yr old female who presents today to discuss smoking cessation.  She has cut back from 1 PPD to 1/2 PPD.  She has been chewing gum and exercising more.  She is motivated to completely quit. States that she never picked up the chantix.  Hoping to avoid pills.  She has tried the nicotine patch in the past and caused her palpitations and hallucinations.    Iron deficiency anemia- she saw gyn last year and was prescribed lysteda.  This was helpful but she is past due for follow up and it was not refilled. Reports that the Integra supplement caused constipation. Instead she is using an otc iron containing supplement called "Mega food blood builder minis." She is better able to tolerate this supplement.  She continues to have very heavy periods the first 2 days.  Has associated fatigue.  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  Wants to persue online business degree.  Review of Systems See HPI  Past Medical History:  Diagnosis Date  . H/O gastroesophageal reflux (GERD)   . History of stomach ulcers   . Iron deficiency anemia 11/01/2018     Social History   Socioeconomic History  . Marital status: Single    Spouse name: Not on file  . Number of children: Not on file  . Years of education: Not on file  . Highest education level: Not on file  Occupational History  . Not on file  Tobacco Use  . Smoking status: Current Every Day Smoker    Packs/day: 0.50    Types: Cigarettes    Start date: 01/21/2000  . Smokeless tobacco: Never Used  Vaping Use  . Vaping Use: Former  Substance and Sexual Activity  . Alcohol use: Not on file    Comment: occasional  . Drug use: Not on file  . Sexual activity: Yes    Partners: Female  Other Topics Concern  . 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 Difficulty of Paying Living Expenses: Not on file  Food Insecurity:   . Worried About Charity fundraiser in the Last Year: Not on file  . Ran Out of Food in the Last Year: Not on file  Transportation Needs:   . Lack of Transportation (Medical): Not on file  . Lack of Transportation (Non-Medical): Not on file  Physical Activity:   . Days of Exercise per Week: Not on file  . Minutes of Exercise per Session: Not on file  Stress:   . Feeling of Stress : Not on file  Social Connections:   . Frequency of Communication with Friends and Family: Not on file  . Frequency of Social Gatherings with Friends and Family: Not on file  . Attends Religious Services: Not on file  . Active Member of Clubs or Organizations: Not on file  . Attends Archivist Meetings: Not on file  . Marital Status: Not on file  Intimate Partner Violence:   .  Fear of Current or Ex-Partner: Not on file  . Emotionally Abused: Not on file  . Physically Abused: Not on file  . Sexually Abused: Not on file    Past Surgical History:  Procedure Laterality Date  . ESOPHAGOGASTRODUODENOSCOPY     to evaluate for ulcers  . OTHER SURGICAL HISTORY  2015   boil removed from buttock    Family History  Problem Relation Age of Onset  . Hypertension Mother   . Other Mother        borderline diabetes  . Hypertension Father   . Asthma Father   . Hyperlipidemia Father   . Hypertension Brother   . Hypertension Maternal Grandmother   . Diabetes Maternal Grandmother   . Seizures Maternal Grandmother   . Hypertension Brother     No Known Allergies  Current Outpatient Medications on File Prior to Visit  Medication Sig Dispense Refill  . cetirizine (ZYRTEC) 10  MG tablet Take 1 tablet (10 mg total) by mouth daily. 30 tablet 5  . Fe Fum-FePoly-FA-Vit C-Vit B3 (INTEGRA F) 125-1 MG CAPS Take 1 capsule by mouth daily. 30 capsule 6  . ferrous sulfate 325 (65 FE) MG tablet Take 1 tablet (325 mg total) by mouth 2 (two) times daily with a meal.  3  . pantoprazole (PROTONIX) 40 MG tablet Take 1 tablet (40 mg total) by mouth daily. 30 tablet 5  . tranexamic acid (LYSTEDA) 650 MG TABS tablet Take 2 tablets (1,300 mg total) by mouth 3 (three) times daily. Take during menses for a maximum of five days 30 tablet 3  . varenicline (CHANTIX STARTING MONTH PAK) 0.5 MG X 11 & 1 MG X 42 tablet one 0.5 mg tab by mouth 1 x  daily for 3 days, then increase to one 0.5 mg tablet 2 x daily for 4 days, then increase to one 1 mg tab bid 53 tablet 0   No current facility-administered medications on file prior to visit.    BP 124/71 (BP Location: Right Arm, Patient Position: Sitting, Cuff Size: Small)   Pulse 88   Temp 98.6 F (37 C) (Oral)   Resp 16   Ht 5\' 2"  (1.575 m)   Wt 141 lb (64 kg)   SpO2 100%   BMI 25.79 kg/m       Objective:   Physical Exam Constitutional:      Appearance: She is well-developed.  Cardiovascular:     Rate and Rhythm: Normal rate and regular rhythm.     Heart sounds: Normal heart sounds. No murmur heard.   Pulmonary:     Effort: Pulmonary effort is normal. No respiratory distress.     Breath sounds: Normal breath sounds. No wheezing.  Psychiatric:        Behavior: Behavior normal.        Thought Content: Thought content normal.        Judgment: Judgment normal.           Assessment & Plan:  Iron deficiency anemia- clinically uncontrolled. check cbc, iron studies.  If low, we discussed referral to hematology for IV iron infusions.   Menorrhagia-  recommended that she schedule a follow up appointment with GYN.  Tobacco abuse- I commended her on cutting down. Recommended trial of nicotine gum.  If she is unsuccessful with the gum,  we discussed trial of wellbutrin.  Chantix remains off the market.   This visit occurred during the SARS-CoV-2 public health emergency.  Safety protocols were in place, including  screening questions prior to the visit, additional usage of staff PPE, and extensive cleaning of exam room while observing appropriate contact time as indicated for disinfecting solutions.

## 2019-12-14 ENCOUNTER — Encounter: Payer: Self-pay | Admitting: Family

## 2019-12-14 LAB — CBC WITH DIFFERENTIAL/PLATELET
Basophils Absolute: 0 10*3/uL (ref 0.0–0.1)
Basophils Relative: 0.6 % (ref 0.0–3.0)
Eosinophils Absolute: 0.4 10*3/uL (ref 0.0–0.7)
Eosinophils Relative: 5.1 % — ABNORMAL HIGH (ref 0.0–5.0)
HCT: 36.3 % (ref 36.0–46.0)
Hemoglobin: 11.8 g/dL — ABNORMAL LOW (ref 12.0–15.0)
Lymphocytes Relative: 31.8 % (ref 12.0–46.0)
Lymphs Abs: 2.6 10*3/uL (ref 0.7–4.0)
MCHC: 32.6 g/dL (ref 30.0–36.0)
MCV: 84.5 fl (ref 78.0–100.0)
Monocytes Absolute: 0.7 10*3/uL (ref 0.1–1.0)
Monocytes Relative: 9.2 % (ref 3.0–12.0)
Neutro Abs: 4.3 10*3/uL (ref 1.4–7.7)
Neutrophils Relative %: 53.3 % (ref 43.0–77.0)
Platelets: 303 10*3/uL (ref 150.0–400.0)
RBC: 4.29 Mil/uL (ref 3.87–5.11)
RDW: 14.8 % (ref 11.5–15.5)
WBC: 8.2 10*3/uL (ref 4.0–10.5)

## 2019-12-14 LAB — IBC + FERRITIN
Ferritin: 14.2 ng/mL (ref 10.0–291.0)
Iron: 38 ug/dL — ABNORMAL LOW (ref 42–145)
Saturation Ratios: 10.1 % — ABNORMAL LOW (ref 20.0–50.0)
Transferrin: 270 mg/dL (ref 212.0–360.0)

## 2019-12-14 LAB — TSH: TSH: 0.69 u[IU]/mL (ref 0.35–4.50)

## 2019-12-28 ENCOUNTER — Encounter: Payer: Self-pay | Admitting: Family

## 2020-02-15 ENCOUNTER — Other Ambulatory Visit: Payer: Self-pay

## 2020-02-15 DIAGNOSIS — D500 Iron deficiency anemia secondary to blood loss (chronic): Secondary | ICD-10-CM

## 2020-02-15 DIAGNOSIS — D219 Benign neoplasm of connective and other soft tissue, unspecified: Secondary | ICD-10-CM

## 2020-02-15 DIAGNOSIS — N920 Excessive and frequent menstruation with regular cycle: Secondary | ICD-10-CM

## 2020-02-15 MED ORDER — INTEGRA F 125-1 MG PO CAPS: 1.0000 | ORAL_CAPSULE | Freq: Every day | ORAL | 6 refills | Status: DC

## 2020-03-13 ENCOUNTER — Encounter: Payer: Self-pay | Admitting: Family

## 2020-03-13 ENCOUNTER — Other Ambulatory Visit: Payer: Self-pay

## 2020-03-13 ENCOUNTER — Ambulatory Visit: Payer: BC Managed Care – PPO | Admitting: Family

## 2020-03-13 VITALS — BP 128/79 | HR 90 | Temp 98.4°F | Resp 16 | Wt 142.8 lb

## 2020-03-13 DIAGNOSIS — N921 Excessive and frequent menstruation with irregular cycle: Secondary | ICD-10-CM

## 2020-03-13 DIAGNOSIS — D500 Iron deficiency anemia secondary to blood loss (chronic): Secondary | ICD-10-CM

## 2020-03-13 DIAGNOSIS — N946 Dysmenorrhea, unspecified: Secondary | ICD-10-CM | POA: Diagnosis not present

## 2020-03-13 DIAGNOSIS — F439 Reaction to severe stress, unspecified: Secondary | ICD-10-CM

## 2020-03-13 DIAGNOSIS — K219 Gastro-esophageal reflux disease without esophagitis: Secondary | ICD-10-CM

## 2020-03-13 DIAGNOSIS — Z720 Tobacco use: Secondary | ICD-10-CM

## 2020-03-13 NOTE — Progress Notes (Signed)
Subjective:    Patient ID: Emily Robbins, female    DOB: 1980/03/15, 40 y.o.   MRN: [id number]  HPI  Patient is a 40 yr old female who presents today for follow up. She reports that her periods continue to be heavy with severe cramping. She feels that her fibroids are causing her abdomen to protrude.  She has an upcoming appointment with GYN.    Lab Results  Component Value Date   WBC 8.2 12/13/2019   HGB 11.8 (L) 12/13/2019   HCT 36.3 12/13/2019   MCV 84.5 12/13/2019   PLT 303.0 12/13/2019   Tobacco abuse- Still smoking but she is trying to cut back on cigarettes to 8-9 a day.    GERD- not taking protonix but reports resolution of "stomach pains."     Review of Systems    see HPI  Past Medical History:  Diagnosis Date  . H/O gastroesophageal reflux (GERD)   . History of stomach ulcers   . Iron deficiency anemia 11/01/2018     Social History   Socioeconomic History  . Marital status: Single    Spouse name: Not on file  . Number of children: Not on file  . Years of education: Not on file  . Highest education level: Not on file  Occupational History  . Not on file  Tobacco Use  . Smoking status: Current Every Day Smoker    Packs/day: 0.50    Types: Cigarettes    Start date: 01/21/2000  . Smokeless tobacco: Never Used  Vaping Use  . Vaping Use: Former  Substance and Sexual Activity  . Alcohol use: Not on file    Comment: occasional  . Drug use: Not on file  . Sexual activity: Yes    Partners: Female  Other Topics Concern  . 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 ESOPHAGOGASTRODUODENOSCOPY     to evaluate for ulcers  . OTHER SURGICAL HISTORY  2015   boil removed from buttock    Family History  Problem Relation Age of Onset  . Hypertension Mother   . Other Mother        borderline diabetes  . Hypertension Father   . Asthma Father   . Hyperlipidemia Father   . Hypertension Brother   . Hypertension Maternal Grandmother   . Diabetes Maternal Grandmother   . Seizures Maternal Grandmother   . Hypertension Brother     No Known Allergies  Current Outpatient Medications on File Prior to Visit  Medication Sig Dispense Refill  . cetirizine (ZYRTEC) 10 MG tablet Take 1 tablet (10 mg total) by mouth daily. 30 tablet 5  . Fe Fum-FePoly-FA-Vit C-Vit B3 (INTEGRA F) 125-1 MG CAPS Take 1 capsule by mouth daily. 30 capsule 6  . ferrous sulfate 325 (65 FE) MG tablet Take 1 tablet (325 mg total) by mouth 2 (two) times daily with a meal.  3  . tranexamic acid (LYSTEDA) 650 MG  TABS tablet Take 2 tablets (1,300 mg total) by mouth 3 (three) times daily. Take during menses for a maximum of five days 30 tablet 3   No current facility-administered medications on file prior to visit.    BP 128/79 (BP Location: Right Arm, Patient Position: Sitting, Cuff Size: Small)   Pulse 90   Temp 98.4 F (36.9 C) (Oral)   Resp 16   Wt 142 lb 12.8 oz (64.8 kg)   SpO2 100%   BMI 26.12 kg/m    Objective:   Physical Exam Constitutional:      Appearance: She is well-developed and well-nourished.  Cardiovascular:     Rate and Rhythm: Normal rate and regular rhythm.     Heart sounds: Normal heart sounds. No murmur heard.   Pulmonary:     Effort: Pulmonary effort is normal. No respiratory distress.     Breath sounds: Normal breath sounds. No wheezing.  Psychiatric:        Mood and Affect: Mood and affect normal.        Behavior: Behavior normal.        Thought  Content: Thought content normal.        Judgment: Judgment normal.           Assessment & Plan:  Iron deficiency anemia- obtain follow up CBC, iron studies.  metrorrhagia/Dysmenorrhea- encouraged her to keep her upcoming appointment with GYN.  GERD- stable without protonix. Continue gerd diet.   Stress- she is looking for a new therapist to discuss family and work stressors.  Has also had a close death recently. I gave her a list of local counseling groups.  Tobacco abuse- encouraged her to continue her cessation efforts. We discussed nicotine patch or gum/lozenges as cessation aids.  This visit occurred during the SARS-CoV-2 public health emergency.  Safety protocols were in place, including screening questions prior to the visit, additional usage of staff PPE, and extensive cleaning of exam room while observing appropriate contact time as indicated for disinfecting solutions.

## 2020-03-13 NOTE — Patient Instructions (Signed)
Please complete lab work prior to leaving.   

## 2020-03-14 LAB — CBC WITH DIFFERENTIAL/PLATELET
Basophils Absolute: 0 10*3/uL (ref 0.0–0.1)
Basophils Relative: 0.5 % (ref 0.0–3.0)
Eosinophils Absolute: 0.2 10*3/uL (ref 0.0–0.7)
Eosinophils Relative: 2.8 % (ref 0.0–5.0)
HCT: 39.2 % (ref 36.0–46.0)
Hemoglobin: 12.7 g/dL (ref 12.0–15.0)
Lymphocytes Relative: 37 % (ref 12.0–46.0)
Lymphs Abs: 3.1 10*3/uL (ref 0.7–4.0)
MCHC: 32.4 g/dL (ref 30.0–36.0)
MCV: 85.2 fl (ref 78.0–100.0)
Monocytes Absolute: 0.8 10*3/uL (ref 0.1–1.0)
Monocytes Relative: 9.3 % (ref 3.0–12.0)
Neutro Abs: 4.2 10*3/uL (ref 1.4–7.7)
Neutrophils Relative %: 50.4 % (ref 43.0–77.0)
Platelets: 315 10*3/uL (ref 150.0–400.0)
RBC: 4.61 Mil/uL (ref 3.87–5.11)
RDW: 15 % (ref 11.5–15.5)
WBC: 8.2 10*3/uL (ref 4.0–10.5)

## 2020-03-14 LAB — IRON: Iron: 76 ug/dL (ref 42–145)

## 2020-03-14 LAB — FERRITIN: Ferritin: 20.5 ng/mL (ref 10.0–291.0)

## 2020-03-26 ENCOUNTER — Encounter: Payer: Self-pay | Admitting: Obstetrics & Gynecology

## 2020-03-26 ENCOUNTER — Ambulatory Visit (INDEPENDENT_AMBULATORY_CARE_PROVIDER_SITE_OTHER): Payer: BC Managed Care – PPO | Admitting: Obstetrics & Gynecology

## 2020-03-26 ENCOUNTER — Other Ambulatory Visit: Payer: Self-pay

## 2020-03-26 VITALS — BP 117/69 | HR 90 | Wt 146.0 lb

## 2020-03-26 DIAGNOSIS — D500 Iron deficiency anemia secondary to blood loss (chronic): Secondary | ICD-10-CM

## 2020-03-26 DIAGNOSIS — N946 Dysmenorrhea, unspecified: Secondary | ICD-10-CM | POA: Diagnosis not present

## 2020-03-26 DIAGNOSIS — Z01419 Encounter for gynecological examination (general) (routine) without abnormal findings: Secondary | ICD-10-CM | POA: Diagnosis not present

## 2020-03-26 DIAGNOSIS — R102 Pelvic and perineal pain: Secondary | ICD-10-CM

## 2020-03-26 DIAGNOSIS — D219 Benign neoplasm of connective and other soft tissue, unspecified: Secondary | ICD-10-CM | POA: Diagnosis not present

## 2020-03-26 NOTE — Progress Notes (Signed)
Subjective:     Emily Robbins is a 40 y.o. female here for a routine exam.  Current complaints: Pt reports increased pain with cycles and bleeding with clots.  The menses are shorter on the TXA but, there are more clots. The pain is now between cycles and the fibroids have grown in size.  Pt is a G0 her LMP is due now. She reports monthly cycles that now last 5 days. She does not want to surrender her ability to conceive although she does not know if she wants children.  She wants to keep her uterus but, wants management of the fibroids.     Gynecologic History Patient's last menstrual period was 02/29/2020. Contraception: none Last Pap: 12/09/2018. Results were: normal Last mammogram: n/a.   Obstetric History OB History  Gravida Para Term Preterm AB Living  0 0 0 0 0 0  SAB IAB Ectopic Multiple Live Births  0 0 0 0 0   The following portions of the patient's history were reviewed and updated as appropriate: allergies, current medications, past family history, past medical history, past social history, past surgical history and problem list.  Review of Systems Pertinent items are noted in HPI.    Objective:  BP 117/69   Pulse 90   Wt 146 lb (66.2 kg)   LMP 02/29/2020   BMI 26.70 kg/m  General Appearance:    Alert, cooperative, no distress, appears stated age  Head:    Normocephalic, without obvious abnormality, atraumatic  Eyes:    conjunctiva/corneas clear, EOM's intact, both eyes  Ears:    Normal external ear canals, both ears  Nose:   Nares normal, septum midline, mucosa normal, no drainage    or sinus tenderness  Throat:   Lips, mucosa, and tongue normal; teeth and gums normal  Neck:   Supple, symmetrical, trachea midline, no adenopathy;    thyroid:  no enlargement/tenderness/nodules  Back:     Symmetric, no curvature, ROM normal, no CVA tenderness  Lungs:     respirations unlabored  Chest Wall:    No tenderness or deformity   Heart:    Regular rate and rhythm   Breast Exam:    No tenderness, masses, or nipple abnormality  Abdomen:     Soft, non-tender, bowel sounds active all four quadrants,    no masses, no organomegaly  Genitalia:    Normal female without lesion, discharge or tenderness   Uterus enlarged- to the umbilicus with a very wide base. Mobile.   Extremities:   Extremities normal, atraumatic, no cyanosis or edema  Pulses:   2+ and symmetric all extremities  Skin:   Skin color, texture, turgor normal, no rashes or lesions     Assessment:    Healthy female exam.   Fibroids- symptomatic Iron def anemia due to chronic blood loss- keep daily integra   Plan:   CBC Patient desires surgical management with abd myomectomy.  The risks of surgery were discussed in detail with the patient including but not limited to: bleeding which may require transfusion or reoperation; infection which may require prolonged hospitalization or re-hospitalization and antibiotic therapy; injury to bowel, bladder, ureters and major vessels or other surrounding organs; need for additional procedures including laparotomy; thromboembolic phenomenon, incisional problems and other postoperative or anesthesia complications.  Patient was told that the likelihood that her condition and symptoms will be treated effectively with this surgical management was very high; the postoperative expectations were also discussed in detail. The patient also understands the  alternative treatment options which were discussed in full. All questions were answered.  She was told that she will be contacted by our surgical scheduler regarding the time and date of her surgery; routine preoperative instructions of having nothing to eat or drink after midnight on the day prior to surgery and also coming to the hospital 1 1/2 hours prior to her time of surgery were also emphasized.  She was told she may be called for a preoperative appointment about a week prior to surgery and will be given further  preoperative instructions at that visit. Printed patient education handouts about the procedure were given to the patient to review at home.  Emily Robbins L. Harraway-Smith, M.D., [facility]

## 2020-03-26 NOTE — Patient Instructions (Signed)
Myomectomy  Myomectomy is a surgery to remove a noncancerous fibroid (myoma) from the uterus. Myomas are tumors made up of fibrous tissue. They are often called fibroid tumors. They can range from the size of a pea to the size of a grapefruit. In a myomectomy, the fibroid tumor is removed without removing the uterus. This surgery is usually done if the tumor is growing or causing symptoms, such as pain, pressure, bleeding, pain during intercourse, or problems getting pregnant. Tell a health care provider about:  Any allergies you have.  All medicines you are taking, including vitamins, herbs, eye drops, creams, and over-the-counter medicines.  Any problems you or family members have had with anesthetic medicines.  Any blood disorders you have.  Any surgeries you have had.  Any medical conditions you have.  Whether you are pregnant or may be pregnant. What are the risks? Generally, this is a safe procedure. However, problems may occur, including:  Bleeding.  Infection.  Allergic reactions to medicines.  Damage to nearby structures or organs.  Blood clots in the legs, chest, or brain.  Scar tissue on nearby organs and in the pelvis. This may require another surgery to remove the scar tissue. What happens before the procedure? Staying hydrated Follow instructions from your health care provider about hydration, which may include:  Up to 2 hours before the procedure - you may continue to drink clear liquids, such as water, clear fruit juice, black coffee, and plain tea.   Eating and drinking restrictions Follow instructions from your health care provider about eating and drinking, which may include:  8 hours before the procedure - stop eating heavy meals or foods, such as meat, fried foods, or fatty foods.  6 hours before the procedure - stop eating light meals or foods, such as toast or cereal.  6 hours before the procedure - stop drinking milk or drinks that contain  milk.  2 hours before the procedure - stop drinking clear liquids. Medicines Ask your health care provider about:  Changing or stopping your regular medicines. This is especially important if you are taking diabetes medicines or blood thinners.  Taking medicines such as aspirin and ibuprofen. These medicines can thin your blood. Do not take these medicines unless your health care provider tells you to take them.  Taking over-the-counter medicines, vitamins, herbs, and supplements. General instructions  Do not use any products that contain nicotine or tobacco for at least 4 weeks before the procedure. These products include cigarettes, chewing tobacco, and vaping devices, such as e-cigarettes. If you need help quitting, ask your health care provider.  Plan to have a responsible adult take you home from the hospital or clinic.  Plan to have a responsible adult care for you for the time you are told after you leave the hospital or clinic. This is important.  Ask your health care provider what steps will be taken to help prevent infection. These steps may include: ? Removing hair at the surgery site. ? Washing skin with a germ-killing soap. ? Taking antibiotic medicine. What happens during the procedure?  An IV will be inserted into one of your veins.  You will be given one or more of the following: ? A medicine to help you relax (sedative). ? A medicine to make you fall asleep (general anesthetic).  A small, thin tube (catheter) will be inserted into your bladder to drain urine.  One of the following methods will be used to remove the fibroid. The method used will  depend on the size, shape, location, and number of fibroids. ? Hysteroscopic myomectomy. This may be done when the fibroid tumor is inside the cavity of the uterus. A long, thin tube with a lens (hysteroscope) will be inserted into the uterus through the vagina. A salt and water solution called saline will be put into the  uterus. This will expand the uterus and allow the surgeon to see the fibroids. Tools will be passed through the hysteroscope to remove the fibroid tumor in pieces. ? Laparoscopic myomectomy. A few small incisions will be made in the lower abdomen. A thin, lighted tube with a camera (laparoscope) will be inserted through one of the incisions. This will give the surgeon a good view of the area. The fibroid tumor will be removed through the other incisions. The incisions will then be closed with stitches (sutures) or staples. ? Abdominal myomectomy. This is done when the fibroid tumor cannot be removed with a hysteroscope or laparoscope. The fibroid tumor will be removed through a larger incision that is made in the abdomen. The incision will be closed with sutures or staples. Recovery time will be longer with this method. These procedures may vary among health care providers and hospitals. What can I expect after the procedure?  Your blood pressure, heart rate, breathing rate, and blood oxygen level will be monitored until you leave the hospital or clinic.  The IV and catheter will remain inserted for a period of time.  You may be given medicine for pain as needed.  You may be given an antibiotic medicine if needed.  If you were given a sedative during the procedure, it can affect you for several hours. Do not drive or operate machinery until your health care provider says that it is safe. Summary  Myomectomy is a surgery to remove a noncancerous fibroid (myoma) from the uterus.  This surgery is usually done if the tumor is growing or causing symptoms, such as pain, pressure, bleeding, pain during intercourse, or problems getting pregnant.  Follow instructions from your health care provider about eating and drinking before the procedure.  Recovery time depends on the method used in this procedure. The abdominal method will require a longer recovery. This information is not intended to replace  advice given to you by your health care provider. Make sure you discuss any questions you have with your health care provider. Document Revised: 08/11/2019 Document Reviewed: 08/11/2019 Elsevier Patient Education  2021 [place] [employer] The following information offers guidance on how to care for yourself after your procedure. Your health care provider may also give you more specific instructions. If you have problems or questions, contact your health care provider. What can I expect after the procedure? After the procedure, it is common to have:  Pain in your abdomen and at the incision sites.  Vaginal bleeding. Follow these instructions at home: Medicines  Take over-the-counter and prescription medicines only as told by your health care provider.  If you were prescribed an antibiotic medicine, take it as told by your health care provider. Do not stop using the antibiotic even if you start to feel better.  If you are taking blood thinners: ? Talk with your health care provider before you take any medicines that contain aspirin or NSAIDs, such as ibuprofen. These medicines increase your risk for dangerous bleeding. ? Take your medicine exactly as told, at the same time every day. ? Avoid activities that could cause injury or bruising, and follow instructions about  how to prevent falls. ? Wear a medical alert bracelet or carry a card that lists what medicines you take.  Ask your health care provider if the medicine prescribed to you: ? Requires you to avoid driving or using machinery. ? Can cause constipation. You may need to take these actions to prevent or treat constipation:  Drink enough fluid to keep your urine pale yellow.  Take over-the-counter or prescription medicines.  Eat foods that are high in fiber, such as beans, whole grains, and fresh fruits and vegetables.  Limit foods that are high in fat and processed sugars, such as fried or sweet  foods. Incision care  Follow instructions from your health care provider about how to take care of any incisions. Make sure you: ? Wash your hands with soap and water for at least 20 seconds before and after you change your bandage (dressing). If soap and water are not available, use hand sanitizer. ? Change your dressing as told by your health care provider. ? Leave stitches (sutures), skin glue, or adhesive strips in place. These skin closures may need to stay in place for 2 weeks or longer. If adhesive strip edges start to loosen and curl up, you may trim the loose edges. Do not remove adhesive strips completely unless your health care provider tells you to do that.  Check your incision areas every day for signs of infection. Check for: ? Redness, swelling, or pain. ? More fluid or blood. ? Warmth. ? Pus or a bad smell.   Activity  Do not lift anything that is heavier than 5 lb (2.3 kg), or the limit that you are told, until your health care provider says that it is safe.  Return to your normal activities as told by your health care provider. Ask your health care provider what activities are safe for you.  Rest as told by your health care provider.  Avoid sitting for a long time without moving. Get up to take short walks every 1-2 hours. This is important to improve blood flow and breathing. Ask for help if you feel weak or unsteady.  Do not douche, use tampons, or have sexual intercourse until your health care provider approves.  Practice deep breathing and coughing. If it hurts to cough, try holding a pillow against your belly as you cough.   General instructions  Do not use any products that contain nicotine or tobacco. These products include cigarettes, chewing tobacco, and vaping devices, such as e-cigarettes. These can delay incision healing. If you need help quitting, ask your health care provider.  Do not take baths, swim, or use a hot tub until your health care provider  approves. Ask your health care provider if you may take showers. You may only be allowed to take sponge baths.  Do not drink alcohol until your health care provider says it is okay.  Wear compression stockings as told by your health care provider. These stockings help to prevent blood clots and reduce swelling in your legs.  Keep all follow-up visits. This is important. Contact a health care provider if:  You have a fever or chills.  You have more redness, swelling, or pain around your incision.  You have fluid, blood, pus, or a bad smell coming from your incision.  Your incision feels warm to the touch.  You have a rash.  You have pain when you urinate or blood in your urine.  You have nausea, vomiting, or diarrhea. Get help right away if:  You have trouble breathing or shortness of breath.  You have chest pain.  You feel weak or light-headed.  You have pain, swelling, or redness in your legs.  You become dizzy and faint.  You have heavy vaginal bleeding or blood clots that are larger than a quarter in size.  You have an incision that is opening up.  You have increasing abdominal pain that does not get better with medicine. These symptoms may represent a serious problem that is an emergency. Do not wait to see if the symptoms will go away. Get medical help right away. Call your local emergency services (911 in the [place] Do not drive yourself to the hospital. Summary  After the procedure, it is common to have pain at the incision sites.  If you were prescribed an antibiotic medicine, take it as told by your health care provider. Do not stop using the antibiotic even if you start to feel better.  Do not douche, use tampons, or have sexual intercourse until your health care provider approves.  Return to your normal activities as told by your health care provider. Ask your health care provider what activities are safe for you. This information is not intended to replace  advice given to you by your health care provider. Make sure you discuss any questions you have with your health care provider. Document Revised: 08/11/2019 Document Reviewed: 08/11/2019 Elsevier Patient Education  [place].

## 2020-03-27 LAB — CBC
Hematocrit: 39.8 % (ref 34.0–46.6)
Hemoglobin: 12.8 g/dL (ref 11.1–15.9)
MCH: 27.2 pg (ref 26.6–33.0)
MCHC: 32.2 g/dL (ref 31.5–35.7)
MCV: 85 fL (ref 79–97)
Platelets: 342 10*3/uL (ref 150–450)
RBC: 4.71 x10E6/uL (ref 3.77–5.28)
RDW: 13.3 % (ref 11.7–15.4)
WBC: 7.4 10*3/uL (ref 3.4–10.8)

## 2020-05-03 ENCOUNTER — Encounter: Payer: Self-pay | Admitting: Family

## 2020-05-03 ENCOUNTER — Other Ambulatory Visit: Payer: Self-pay | Admitting: Family Medicine

## 2020-05-04 MED ORDER — ALBUTEROL SULFATE HFA 108 (90 BASE) MCG/ACT IN AERS: 2.0000 | INHALATION_SPRAY | Freq: Four times a day (QID) | RESPIRATORY_TRACT | 1 refills | Status: DC | PRN

## 2020-05-14 ENCOUNTER — Ambulatory Visit (INDEPENDENT_AMBULATORY_CARE_PROVIDER_SITE_OTHER): Payer: BC Managed Care – PPO | Admitting: Obstetrics & Gynecology

## 2020-05-14 ENCOUNTER — Encounter: Payer: Self-pay | Admitting: Obstetrics & Gynecology

## 2020-05-14 ENCOUNTER — Other Ambulatory Visit: Payer: Self-pay

## 2020-05-14 VITALS — BP 120/69 | HR 76 | Wt 144.0 lb

## 2020-05-14 DIAGNOSIS — D251 Intramural leiomyoma of uterus: Secondary | ICD-10-CM

## 2020-05-14 NOTE — Progress Notes (Signed)
History:  40 y.o. G0P0000 here today for preop for myomectomy. Pt reports pain and heavy bleeding with clots due to large uterine fibroids. She is scheduled for a myomectomy and is here for further discussion.     The following portions of the patient's history were reviewed and updated as appropriate: allergies, current medications, past family history, past medical history, past social history, past surgical history and problem list.  Review of Systems:  Pertinent items are noted in HPI.    Objective:  Physical Exam Blood pressure 120/69, pulse 76, weight 144 lb (65.3 kg), last menstrual period 04/28/2020.  CONSTITUTIONAL: Well-developed, well-nourished female in no acute distress.  HENT:  Normocephalic, atraumatic EYES: Conjunctivae and EOM are normal. No scleral icterus.  NECK: Normal range of motion SKIN: Skin is warm and dry. No rash noted. Not diaphoretic.No pallor. [place]: Alert and oriented to person, place, and time. Normal coordination.  Abd: Soft, nontender and nondistended; mass palpable at the umbilicus.  Pelvic: not repeated.   Labs and Imaging 05/10/2018 CLINICAL DATA:  Suprapubic mass.  EXAM: TRANSABDOMINAL AND TRANSVAGINAL ULTRASOUND OF PELVIS  TECHNIQUE: Both transabdominal and transvaginal ultrasound examinations of the pelvis were performed. Transabdominal technique was performed for global imaging of the pelvis including uterus, ovaries, adnexal regions, and pelvic cul-de-sac. It was necessary to proceed with endovaginal exam following the transabdominal exam to visualize the endometrium and ovaries.  COMPARISON:  None  FINDINGS: Uterus  Measurements: 13.8 x 9.3 x 10.3 cm = volume: 696 mL. There is a large fibroid off the anterior left uterine fundus measuring 9.3 x 8.7 x 9.5 cm.  Endometrium  Thickness: 11 mm.  No focal abnormality visualized.  Right ovary  Measurements: 3.0 x 2.1 x 2.7 cm = volume: 8.9 mL. Normal appearance/no  adnexal mass.  Left ovary  Measurements: 3.3 x 1.8 x 3.0 cm = volume: 8.8 mL. Normal appearance/no adnexal mass.  Other findings  Trace fluid in the cul-de-sac, physiologic.  IMPRESSION: 1. There is a large fibroid off the anterior left uterine fundus, likely accounting for the palpable suprapubic mass. No other abnormalities identified.  Assessment & Plan:  Symptomatic uterine fibroids.   Patient desires surgical management with myomectomy via laparotomy.  The risks of surgery were discussed in detail with the patient including but not limited to: bleeding which may require transfusion or reoperation; infection which may require prolonged hospitalization or re-hospitalization and antibiotic therapy; injury to bowel, bladder, ureters and major vessels or other surrounding organs; need for additional procedures including laparotomy; thromboembolic phenomenon, incisional problems and other postoperative or anesthesia complications.  Patient was told that the likelihood that her condition and symptoms will be treated effectively with this surgical management was very high; the postoperative expectations were also discussed in detail. The patient also understands the alternative treatment options which were discussed in full. All questions were answered.  She was told that she will be contacted by our surgical scheduler regarding the time and date of her surgery; routine preoperative instructions of having nothing to eat or drink after midnight on the day prior to surgery and also coming to the hospital 1 1/2 hours prior to her time of surgery were also emphasized.  She was told she may be called for a preoperative appointment about a week prior to surgery and will be given further preoperative instructions at that visit. Printed patient education handouts about the procedure were given to the patient to review at home.  Total face-to-face time with patient, review of chart, discussion  with  consultant and coordination of care was 24min.    Ashley Montminy L. Harraway-Smith, M.D., Cherlynn June

## 2020-05-17 NOTE — Progress Notes (Signed)
Surgical Instructions    Your procedure is scheduled on 05/22/20.  Report to [facility] Main Entrance "A" at 11:00 A.M., then check in with the Admitting office.  Call this number if you have problems the morning of surgery:  [phone]   If you have any questions prior to your surgery date call [phone]: Open Monday-Friday 8am-4pm    Remember:  Do not eat after midnight the night before your surgery  You may drink clear liquids until 10:00am the morning of your surgery.   Clear liquids allowed are: Water, Non-Citrus Juices (without pulp), Carbonated Beverages, Clear Tea, Black Coffee Only, and Gatorade    Take these medicines the morning of surgery with A SIP OF WATER  acetaminophen (TYLENOL) if needed albuterol (VENTOLIN HFA) if needed cetirizine (ZYRTEC)  As of today, STOP taking any Aspirin (unless otherwise instructed by your surgeon) Aleve, Naproxen, Ibuprofen, Motrin, Advil, Goody's, BC's, all herbal medications, fish oil, and all vitamins.                     Do not wear jewelry, make up, or nail polish            Do not wear lotions, powders, perfumes/colognes, or deodorant.            Do not shave 48 hours prior to surgery.              Do not bring valuables to the hospital.            [facility] is not responsible for any belongings or valuables.  Do NOT Smoke (Tobacco/Vaping) or drink Alcohol 24 hours prior to your procedure If you use a CPAP at night, you may bring all equipment for your overnight stay.   Contacts, glasses, dentures or bridgework may not be worn into surgery, please bring cases for these belongings   For patients admitted to the hospital, discharge time will be determined by your treatment team.   Patients discharged the day of surgery will not be allowed to drive home, and someone needs to stay with them for 24 hours.    Special instructions:   [place]- Preparing For Surgery  Before surgery, you can play an important role. Because  skin is not sterile, your skin needs to be as free of germs as possible. You can reduce the number of germs on your skin by washing with CHG (chlorahexidine gluconate) Soap before surgery.  CHG is an antiseptic cleaner which kills germs and bonds with the skin to continue killing germs even after washing.    Oral Hygiene is also important to reduce your risk of infection.  Remember - BRUSH YOUR TEETH THE MORNING OF SURGERY WITH YOUR REGULAR TOOTHPASTE  Please do not use if you have an allergy to CHG or antibacterial soaps. If your skin becomes reddened/irritated stop using the CHG.  Do not shave (including legs and underarms) for at least 48 hours prior to first CHG shower. It is OK to shave your face.  Please follow these instructions carefully.   1. Shower the NIGHT BEFORE SURGERY and the MORNING OF SURGERY  2. If you chose to wash your hair, wash your hair first as usual with your normal shampoo.  3. After you shampoo, rinse your hair and body thoroughly to remove the shampoo.  4. Wash Face and genitals (private parts) with your normal soap.   5.  Shower the NIGHT BEFORE SURGERY and the MORNING OF SURGERY with CHG Soap.  6. Use CHG Soap as you would any other liquid soap. You can apply CHG directly to the skin and wash gently with a scrungie or a clean washcloth.   7. Apply the CHG Soap to your body ONLY FROM THE NECK DOWN.  Do not use on open wounds or open sores. Avoid contact with your eyes, ears, mouth and genitals (private parts). Wash Face and genitals (private parts)  with your normal soap.   8. Wash thoroughly, paying special attention to the area where your surgery will be performed.  9. Thoroughly rinse your body with warm water from the neck down.  10. DO NOT shower/wash with your normal soap after using and rinsing off the CHG Soap.  11. Pat yourself dry with a CLEAN TOWEL.  12. Wear CLEAN PAJAMAS to bed the night before surgery  13. Place CLEAN SHEETS on your bed the  night before your surgery  14. DO NOT SLEEP WITH PETS.   Day of Surgery: Take a shower with CHG soap. Wear Clean/Comfortable clothing the morning of surgery Do not apply any deodorants/lotions.   Remember to brush your teeth WITH YOUR REGULAR TOOTHPASTE.   Please read over the following fact sheets that you were given.

## 2020-05-18 ENCOUNTER — Encounter (HOSPITAL_COMMUNITY): Payer: Self-pay

## 2020-05-18 ENCOUNTER — Other Ambulatory Visit: Payer: Self-pay

## 2020-05-18 ENCOUNTER — Encounter (HOSPITAL_COMMUNITY)
Admission: RE | Admit: 2020-05-18 | Discharge: 2020-05-18 | Disposition: A | Discharge status: Home / Self Care | Payer: BC Managed Care – PPO | Source: Ambulatory Visit | Attending: Obstetrics & Gynecology | Admitting: Obstetrics & Gynecology

## 2020-05-18 ENCOUNTER — Encounter: Payer: Self-pay | Admitting: Family

## 2020-05-18 DIAGNOSIS — Z01812 Encounter for preprocedural laboratory examination: Secondary | ICD-10-CM | POA: Diagnosis not present

## 2020-05-18 HISTORY — DX: Gastro-esophageal reflux disease without esophagitis: K21.9

## 2020-05-18 LAB — CBC
HCT: 41.4 % (ref 36.0–46.0)
Hemoglobin: 13 g/dL (ref 12.0–15.0)
MCH: 27.1 pg (ref 26.0–34.0)
MCHC: 31.4 g/dL (ref 30.0–36.0)
MCV: 86.4 fL (ref 80.0–100.0)
Platelets: 321 10*3/uL (ref 150–400)
RBC: 4.79 MIL/uL (ref 3.87–5.11)
RDW: 15.7 % — ABNORMAL HIGH (ref 11.5–15.5)
WBC: 7.4 10*3/uL (ref 4.0–10.5)
nRBC: 0 % (ref 0.0–0.2)

## 2020-05-18 LAB — TYPE AND SCREEN
ABO/RH(D): B POS
Antibody Screen: NEGATIVE

## 2020-05-18 NOTE — Progress Notes (Signed)
PCP - Debbrah Alar NP Cardiologist -   PPM/ICD - denies  -   Chest x-ray - na EKG -na  Stress Test - none ECHO - none Cardiac Cath - none  Sleep Study - denies CPAP - na  Fasting Blood Sugar - na Checks Blood Sugar _na____ times a day  Blood Thinner Instructions:na Aspirin Instructions:  ERAS Protcol - clear liquids until 1000 PRE-SURGERY Ensure or G2-   COVID TEST- scheduled Monday 5//2/22 between 8am-10-am.   Anesthesia review:no   Patient denies shortness of breath, fever, cough and chest pain at PAT appointment   All instructions explained to the patient, with a verbal understanding of the material. Patient agrees to go over the instructions while at home for a better understanding. Patient also instructed to self quarantine after being tested for COVID-19. The opportunity to ask questions was provided.

## 2020-05-21 ENCOUNTER — Other Ambulatory Visit (HOSPITAL_COMMUNITY)
Admission: RE | Admit: 2020-05-21 | Discharge: 2020-05-21 | Disposition: A | Discharge status: Home / Self Care | Payer: BC Managed Care – PPO | Source: Ambulatory Visit | Attending: Obstetrics & Gynecology | Admitting: Obstetrics & Gynecology

## 2020-05-21 ENCOUNTER — Encounter: Payer: Self-pay | Admitting: General Practice

## 2020-05-21 DIAGNOSIS — Z01812 Encounter for preprocedural laboratory examination: Secondary | ICD-10-CM | POA: Insufficient documentation

## 2020-05-21 DIAGNOSIS — Z20822 Contact with and (suspected) exposure to covid-19: Secondary | ICD-10-CM | POA: Insufficient documentation

## 2020-05-22 ENCOUNTER — Inpatient Hospital Stay (HOSPITAL_COMMUNITY)
Admission: RE | Admit: 2020-05-22 | Discharge: 2020-05-24 | DRG: 742 | Disposition: A | Discharge status: Home / Self Care | Payer: BC Managed Care – PPO | Attending: Obstetrics & Gynecology | Admitting: Obstetrics & Gynecology

## 2020-05-22 ENCOUNTER — Encounter (HOSPITAL_COMMUNITY)
Admission: RE | Disposition: A | Discharge status: Home / Self Care | Payer: Self-pay | Source: Home / Self Care | Attending: Obstetrics & Gynecology

## 2020-05-22 ENCOUNTER — Inpatient Hospital Stay (HOSPITAL_COMMUNITY): Payer: BC Managed Care – PPO | Admitting: Certified Registered Nurse Anesthetist

## 2020-05-22 ENCOUNTER — Encounter (HOSPITAL_COMMUNITY): Payer: Self-pay | Admitting: Obstetrics & Gynecology

## 2020-05-22 ENCOUNTER — Inpatient Hospital Stay: Admit: 2020-05-22 | Payer: BC Managed Care – PPO | Admitting: Obstetrics & Gynecology

## 2020-05-22 ENCOUNTER — Other Ambulatory Visit: Payer: Self-pay

## 2020-05-22 DIAGNOSIS — D500 Iron deficiency anemia secondary to blood loss (chronic): Secondary | ICD-10-CM | POA: Diagnosis not present

## 2020-05-22 DIAGNOSIS — D62 Acute posthemorrhagic anemia: Secondary | ICD-10-CM | POA: Diagnosis not present

## 2020-05-22 DIAGNOSIS — F17210 Nicotine dependence, cigarettes, uncomplicated: Secondary | ICD-10-CM | POA: Diagnosis present

## 2020-05-22 DIAGNOSIS — Z9889 Other specified postprocedural states: Secondary | ICD-10-CM

## 2020-05-22 DIAGNOSIS — Z20822 Contact with and (suspected) exposure to covid-19: Secondary | ICD-10-CM | POA: Diagnosis present

## 2020-05-22 DIAGNOSIS — D259 Leiomyoma of uterus, unspecified: Secondary | ICD-10-CM | POA: Diagnosis present

## 2020-05-22 DIAGNOSIS — D251 Intramural leiomyoma of uterus: Principal | ICD-10-CM | POA: Diagnosis present

## 2020-05-22 DIAGNOSIS — D509 Iron deficiency anemia, unspecified: Secondary | ICD-10-CM | POA: Diagnosis present

## 2020-05-22 HISTORY — PX: MYOMECTOMY: SHX85

## 2020-05-22 HISTORY — DX: Intramural leiomyoma of uterus: D25.1

## 2020-05-22 LAB — ABO/RH: ABO/RH(D): B POS

## 2020-05-22 LAB — POCT PREGNANCY, URINE: Preg Test, Ur: NEGATIVE

## 2020-05-22 LAB — SARS CORONAVIRUS 2 (TAT 6-24 HRS): SARS Coronavirus 2: NEGATIVE

## 2020-05-22 SURGERY — MYOMECTOMY, ABDOMINAL APPROACH
Anesthesia: Regional

## 2020-05-22 SURGERY — MYOMECTOMY, ABDOMINAL APPROACH
Anesthesia: Choice

## 2020-05-22 MED ORDER — SCOPOLAMINE 1 MG/3DAYS TD PT72
1.0000 | MEDICATED_PATCH | TRANSDERMAL | Status: DC
  Administered 2020-05-22: 1.5 mg via TRANSDERMAL
  Filled 2020-05-22: qty 1

## 2020-05-22 MED ORDER — DEXAMETHASONE SODIUM PHOSPHATE 10 MG/ML IJ SOLN
INTRAMUSCULAR | Status: DC | PRN
  Administered 2020-05-22: 10 mg via INTRAVENOUS

## 2020-05-22 MED ORDER — MENTHOL 3 MG MT LOZG
1.0000 | LOZENGE | OROMUCOSAL | Status: DC | PRN
Start: 2020-05-22 — End: 2020-05-24

## 2020-05-22 MED ORDER — OXYCODONE HCL 5 MG/5ML PO SOLN
5.0000 mg | Freq: Once | ORAL | Status: DC | PRN
Start: 2020-05-22 — End: 2020-05-22

## 2020-05-22 MED ORDER — BISACODYL 10 MG RE SUPP
10.0000 mg | Freq: Every day | RECTAL | Status: DC | PRN
Start: 2020-05-22 — End: 2020-05-24

## 2020-05-22 MED ORDER — TRAMADOL HCL 50 MG PO TABS
50.0000 mg | ORAL_TABLET | Freq: Four times a day (QID) | ORAL | Status: DC | PRN
  Administered 2020-05-23 – 2020-05-24 (×2): 50 mg via ORAL
  Filled 2020-05-22 (×2): qty 1

## 2020-05-22 MED ORDER — 0.9 % SODIUM CHLORIDE (POUR BTL) OPTIME
TOPICAL | Status: DC | PRN
  Administered 2020-05-22 (×2): 1000 mL

## 2020-05-22 MED ORDER — FENTANYL CITRATE (PF) 100 MCG/2ML IJ SOLN: 50.0000 ug | INTRAMUSCULAR | Status: DC | PRN

## 2020-05-22 MED ORDER — PHENYLEPHRINE 40 MCG/ML (10ML) SYRINGE FOR IV PUSH (FOR BLOOD PRESSURE SUPPORT)
PREFILLED_SYRINGE | INTRAVENOUS | Status: AC
  Filled 2020-05-22: qty 10

## 2020-05-22 MED ORDER — ONDANSETRON HCL 4 MG PO TABS
4.0000 mg | ORAL_TABLET | Freq: Four times a day (QID) | ORAL | Status: DC | PRN
  Administered 2020-05-23: 4 mg via ORAL
  Filled 2020-05-22: qty 1

## 2020-05-22 MED ORDER — BUPIVACAINE HCL (PF) 0.5 % IJ SOLN
INTRAMUSCULAR | Status: AC
  Filled 2020-05-22: qty 30

## 2020-05-22 MED ORDER — FENTANYL CITRATE (PF) 100 MCG/2ML IJ SOLN
INTRAMUSCULAR | Status: AC
  Administered 2020-05-22: 100 ug via INTRAVENOUS
  Filled 2020-05-22: qty 2

## 2020-05-22 MED ORDER — LIDOCAINE 2% (20 MG/ML) 5 ML SYRINGE
INTRAMUSCULAR | Status: AC
  Filled 2020-05-22: qty 5

## 2020-05-22 MED ORDER — ROCURONIUM BROMIDE 10 MG/ML (PF) SYRINGE
PREFILLED_SYRINGE | INTRAVENOUS | Status: AC
  Filled 2020-05-22: qty 10

## 2020-05-22 MED ORDER — PROPOFOL 10 MG/ML IV BOLUS
INTRAVENOUS | Status: DC | PRN
  Administered 2020-05-22: 150 mg via INTRAVENOUS

## 2020-05-22 MED ORDER — SUGAMMADEX SODIUM 200 MG/2ML IV SOLN
INTRAVENOUS | Status: DC | PRN
  Administered 2020-05-22: 50 mg via INTRAVENOUS
  Administered 2020-05-22 (×2): 100 mg via INTRAVENOUS

## 2020-05-22 MED ORDER — FENTANYL CITRATE (PF) 100 MCG/2ML IJ SOLN
INTRAMUSCULAR | Status: AC
  Filled 2020-05-22: qty 2

## 2020-05-22 MED ORDER — OXYCODONE-ACETAMINOPHEN 5-325 MG PO TABS
1.0000 | ORAL_TABLET | ORAL | Status: DC | PRN
Start: 2020-05-22 — End: 2020-05-24
  Administered 2020-05-23: 2 via ORAL
  Administered 2020-05-23 – 2020-05-24 (×3): 1 via ORAL
  Filled 2020-05-22 (×2): qty 1
  Filled 2020-05-22 (×2): qty 2

## 2020-05-22 MED ORDER — ROCURONIUM BROMIDE 100 MG/10ML IV SOLN
INTRAVENOUS | Status: DC | PRN
  Administered 2020-05-22: 80 mg via INTRAVENOUS

## 2020-05-22 MED ORDER — KETOROLAC TROMETHAMINE 30 MG/ML IJ SOLN
30.0000 mg | Freq: Four times a day (QID) | INTRAMUSCULAR | Status: DC
Start: 2020-05-22 — End: 2020-05-22

## 2020-05-22 MED ORDER — DEXAMETHASONE SODIUM PHOSPHATE 10 MG/ML IJ SOLN
INTRAMUSCULAR | Status: AC
  Filled 2020-05-22: qty 1

## 2020-05-22 MED ORDER — FENTANYL CITRATE (PF) 250 MCG/5ML IJ SOLN
INTRAMUSCULAR | Status: AC
  Filled 2020-05-22: qty 5

## 2020-05-22 MED ORDER — ONDANSETRON HCL 4 MG/2ML IJ SOLN
INTRAMUSCULAR | Status: AC
  Filled 2020-05-22: qty 2

## 2020-05-22 MED ORDER — BUPIVACAINE HCL (PF) 0.25 % IJ SOLN
INTRAMUSCULAR | Status: DC | PRN
  Administered 2020-05-22: 27 mL

## 2020-05-22 MED ORDER — PANTOPRAZOLE SODIUM 40 MG PO TBEC
40.0000 mg | DELAYED_RELEASE_TABLET | Freq: Every day | ORAL | Status: DC
Start: 2020-05-22 — End: 2020-05-24
  Administered 2020-05-22 – 2020-05-24 (×3): 40 mg via ORAL
  Filled 2020-05-22 (×3): qty 1

## 2020-05-22 MED ORDER — MIDAZOLAM HCL 2 MG/2ML IJ SOLN
INTRAMUSCULAR | Status: AC
  Administered 2020-05-22: 2 mg via INTRAVENOUS
  Filled 2020-05-22: qty 2

## 2020-05-22 MED ORDER — PROMETHAZINE HCL 25 MG/ML IJ SOLN: 6.2500 mg | INTRAMUSCULAR | Status: DC | PRN

## 2020-05-22 MED ORDER — LACTATED RINGERS IV SOLN: INTRAVENOUS | Status: DC | PRN

## 2020-05-22 MED ORDER — SOD CITRATE-CITRIC ACID 500-334 MG/5ML PO SOLN
30.0000 mL | ORAL | Status: AC
  Administered 2020-05-22: 30 mL via ORAL
  Filled 2020-05-22: qty 15

## 2020-05-22 MED ORDER — MIDAZOLAM HCL 2 MG/2ML IJ SOLN: 2.0000 mg | Freq: Once | INTRAMUSCULAR | Status: AC

## 2020-05-22 MED ORDER — ESMOLOL HCL 100 MG/10ML IV SOLN
INTRAVENOUS | Status: AC
  Filled 2020-05-22: qty 10

## 2020-05-22 MED ORDER — DROPERIDOL 2.5 MG/ML IJ SOLN: 0.6250 mg | Freq: Once | INTRAMUSCULAR | Status: DC | PRN

## 2020-05-22 MED ORDER — KETOROLAC TROMETHAMINE 30 MG/ML IJ SOLN
30.0000 mg | Freq: Four times a day (QID) | INTRAMUSCULAR | Status: AC
  Administered 2020-05-22 – 2020-05-23 (×4): 30 mg via INTRAVENOUS
  Filled 2020-05-22 (×4): qty 1

## 2020-05-22 MED ORDER — FENTANYL CITRATE (PF) 250 MCG/5ML IJ SOLN
INTRAMUSCULAR | Status: DC | PRN
  Administered 2020-05-22 (×2): 50 ug via INTRAVENOUS
  Administered 2020-05-22 (×3): 25 ug via INTRAVENOUS

## 2020-05-22 MED ORDER — POVIDONE-IODINE 10 % EX SWAB
2.0000 | Freq: Once | CUTANEOUS | Status: DC
Start: 2020-05-22 — End: 2020-05-22

## 2020-05-22 MED ORDER — FENTANYL CITRATE (PF) 100 MCG/2ML IJ SOLN: 100.0000 ug | Freq: Once | INTRAMUSCULAR | Status: AC

## 2020-05-22 MED ORDER — VASOPRESSIN 20 UNIT/ML IV SOLN
INTRAVENOUS | Status: DC | PRN
  Administered 2020-05-22: 30 mL via INTRAMUSCULAR

## 2020-05-22 MED ORDER — BUPIVACAINE HCL (PF) 0.25 % IJ SOLN
INTRAMUSCULAR | Status: DC | PRN
  Administered 2020-05-22: 40 mL via EPIDURAL

## 2020-05-22 MED ORDER — LIDOCAINE 2% (20 MG/ML) 5 ML SYRINGE
INTRAMUSCULAR | Status: DC | PRN
  Administered 2020-05-22: 80 mg via INTRAVENOUS

## 2020-05-22 MED ORDER — IBUPROFEN 600 MG PO TABS
600.0000 mg | ORAL_TABLET | Freq: Four times a day (QID) | ORAL | Status: DC
Start: 2020-05-23 — End: 2020-05-22

## 2020-05-22 MED ORDER — PHENYLEPHRINE 40 MCG/ML (10ML) SYRINGE FOR IV PUSH (FOR BLOOD PRESSURE SUPPORT)
PREFILLED_SYRINGE | INTRAVENOUS | Status: DC | PRN
  Administered 2020-05-22: 80 ug via INTRAVENOUS

## 2020-05-22 MED ORDER — ONDANSETRON HCL 4 MG/2ML IJ SOLN
INTRAMUSCULAR | Status: DC | PRN
  Administered 2020-05-22: 4 mg via INTRAVENOUS

## 2020-05-22 MED ORDER — BUPIVACAINE LIPOSOME 1.3 % IJ SUSP
INTRAMUSCULAR | Status: DC | PRN
  Administered 2020-05-22: 20 mL

## 2020-05-22 MED ORDER — BUPIVACAINE HCL (PF) 0.25 % IJ SOLN
INTRAMUSCULAR | Status: AC
  Filled 2020-05-22: qty 30

## 2020-05-22 MED ORDER — VASOPRESSIN 20 UNIT/ML IV SOLN
INTRAVENOUS | Status: AC
  Filled 2020-05-22: qty 1

## 2020-05-22 MED ORDER — IBUPROFEN 600 MG PO TABS
600.0000 mg | ORAL_TABLET | Freq: Four times a day (QID) | ORAL | Status: DC
  Administered 2020-05-24: 600 mg via ORAL
  Filled 2020-05-22 (×2): qty 1

## 2020-05-22 MED ORDER — SIMETHICONE 80 MG PO CHEW
80.0000 mg | CHEWABLE_TABLET | Freq: Four times a day (QID) | ORAL | Status: DC | PRN
  Administered 2020-05-22 – 2020-05-24 (×3): 80 mg via ORAL
  Filled 2020-05-22 (×3): qty 1

## 2020-05-22 MED ORDER — ESMOLOL HCL 100 MG/10ML IV SOLN
INTRAVENOUS | Status: DC | PRN
  Administered 2020-05-22: 20 mg via INTRAVENOUS
  Administered 2020-05-22: 30 mg via INTRAVENOUS

## 2020-05-22 MED ORDER — DEXTROSE-NACL 5-0.45 % IV SOLN
INTRAVENOUS | Status: AC
Start: 2020-05-22 — End: 2020-05-23

## 2020-05-22 MED ORDER — OXYCODONE HCL 5 MG PO TABS
5.0000 mg | ORAL_TABLET | Freq: Once | ORAL | Status: DC | PRN
Start: 2020-05-22 — End: 2020-05-22

## 2020-05-22 MED ORDER — CEFAZOLIN SODIUM-DEXTROSE 2-4 GM/100ML-% IV SOLN
2.0000 g | INTRAVENOUS | Status: AC
Start: 2020-05-22 — End: 2020-05-22
  Administered 2020-05-22: 2 g via INTRAVENOUS
  Filled 2020-05-22: qty 100

## 2020-05-22 MED ORDER — LACTATED RINGERS IV SOLN: INTRAVENOUS | Status: DC

## 2020-05-22 MED ORDER — ONDANSETRON HCL 4 MG/2ML IJ SOLN: 4.0000 mg | Freq: Four times a day (QID) | INTRAMUSCULAR | Status: DC | PRN

## 2020-05-22 MED ORDER — SODIUM CHLORIDE (PF) 0.9 % IJ SOLN
INTRAMUSCULAR | Status: AC
  Filled 2020-05-22: qty 100

## 2020-05-22 MED ORDER — ALBUTEROL SULFATE (2.5 MG/3ML) 0.083% IN NEBU: 3.0000 mL | INHALATION_SOLUTION | Freq: Four times a day (QID) | RESPIRATORY_TRACT | Status: DC | PRN

## 2020-05-22 MED ORDER — HEMOSTATIC AGENTS (NO CHARGE) OPTIME
TOPICAL | Status: DC | PRN
  Administered 2020-05-22 (×2): 1 via TOPICAL

## 2020-05-22 MED ORDER — ZOLPIDEM TARTRATE 5 MG PO TABS: 5.0000 mg | ORAL_TABLET | Freq: Every evening | ORAL | Status: DC | PRN

## 2020-05-22 MED ORDER — ALBUMIN HUMAN 5 % IV SOLN: INTRAVENOUS | Status: DC | PRN

## 2020-05-22 MED ORDER — HYDROMORPHONE HCL 1 MG/ML IJ SOLN
0.2000 mg | INTRAMUSCULAR | Status: DC | PRN
Start: 2020-05-22 — End: 2020-05-24
  Administered 2020-05-22: 0.6 mg via INTRAVENOUS
  Administered 2020-05-22: 0.2 mg via INTRAVENOUS
  Filled 2020-05-22 (×2): qty 1

## 2020-05-22 MED ORDER — KETOROLAC TROMETHAMINE 30 MG/ML IJ SOLN
INTRAMUSCULAR | Status: AC
  Filled 2020-05-22: qty 1

## 2020-05-22 MED ORDER — KETOROLAC TROMETHAMINE 30 MG/ML IJ SOLN
30.0000 mg | Freq: Once | INTRAMUSCULAR | Status: AC
Start: 2020-05-22 — End: 2020-05-22
  Administered 2020-05-22: 30 mg via INTRAVENOUS

## 2020-05-22 MED ORDER — DOCUSATE SODIUM 100 MG PO CAPS
100.0000 mg | ORAL_CAPSULE | Freq: Two times a day (BID) | ORAL | Status: DC
  Administered 2020-05-22 – 2020-05-24 (×4): 100 mg via ORAL
  Filled 2020-05-22 (×4): qty 1

## 2020-05-22 MED ORDER — POLYETHYLENE GLYCOL 3350 17 G PO PACK
17.0000 g | PACK | Freq: Every day | ORAL | Status: DC | PRN
Start: 2020-05-22 — End: 2020-05-24

## 2020-05-22 MED ORDER — ACETAMINOPHEN 500 MG PO TABS
1000.0000 mg | ORAL_TABLET | ORAL | Status: AC
Start: 2020-05-22 — End: 2020-05-22
  Administered 2020-05-22: 1000 mg via ORAL
  Filled 2020-05-22: qty 2

## 2020-05-22 SURGICAL SUPPLY — 40 items
BENZOIN TINCTURE PRP APPL 2/3 (GAUZE/BANDAGES/DRESSINGS) IMPLANT
CANISTER SUCT 3000ML PPV (MISCELLANEOUS) ×2 IMPLANT
DECANTER SPIKE VIAL GLASS SM (MISCELLANEOUS) IMPLANT
DRAIN PENROSE 1/2X12 LTX STRL (WOUND CARE) ×2 IMPLANT
DRAIN PENROSE 1/4X12 LTX (DRAIN) IMPLANT
DRAPE CESAREAN BIRTH W POUCH (DRAPES) ×2 IMPLANT
DRSG OPSITE POSTOP 4X10 (GAUZE/BANDAGES/DRESSINGS) ×2 IMPLANT
DRSG PAD ABDOMINAL 8X10 ST (GAUZE/BANDAGES/DRESSINGS) ×2 IMPLANT
DURAPREP 26ML APPLICATOR (WOUND CARE) ×2 IMPLANT
GAUZE 4X4 16PLY RFD (DISPOSABLE) IMPLANT
GLOVE BIO SURGEON STRL SZ7 (GLOVE) ×2 IMPLANT
GLOVE SURG UNDER POLY LF SZ7 (GLOVE) ×6 IMPLANT
GOWN STRL REUS W/ TWL LRG LVL3 (GOWN DISPOSABLE) ×2 IMPLANT
GOWN STRL REUS W/ TWL XL LVL3 (GOWN DISPOSABLE) ×1 IMPLANT
GOWN STRL REUS W/TWL LRG LVL3 (GOWN DISPOSABLE) ×2
GOWN STRL REUS W/TWL XL LVL3 (GOWN DISPOSABLE) ×1
HEMOSTAT ARISTA ABSORB 3G PWDR (HEMOSTASIS) ×4 IMPLANT
KIT TURNOVER KIT B (KITS) ×2 IMPLANT
NEEDLE HYPO 22GX1.5 SAFETY (NEEDLE) ×4 IMPLANT
NS IRRIG 1000ML POUR BTL (IV SOLUTION) ×2 IMPLANT
PACK ABDOMINAL GYN (CUSTOM PROCEDURE TRAY) ×2 IMPLANT
PAD ARMBOARD 7.5X6 YLW CONV (MISCELLANEOUS) ×4 IMPLANT
PAD OB MATERNITY 4.3X12.25 (PERSONAL CARE ITEMS) ×2 IMPLANT
PENCIL SMOKE EVACUATOR (MISCELLANEOUS) IMPLANT
RTRCTR C-SECT PINK 25CM LRG (MISCELLANEOUS) IMPLANT
SPECIMEN JAR MEDIUM (MISCELLANEOUS) ×2 IMPLANT
SPONGE LAP 18X18 RF (DISPOSABLE) ×4 IMPLANT
STRIP CLOSURE SKIN 1/2X4 (GAUZE/BANDAGES/DRESSINGS) ×2 IMPLANT
SUT VIC AB 0 CT1 18XCR BRD8 (SUTURE) ×1 IMPLANT
SUT VIC AB 0 CT1 27 (SUTURE) ×9
SUT VIC AB 0 CT1 27XBRD ANBCTR (SUTURE) ×9 IMPLANT
SUT VIC AB 0 CT1 8-18 (SUTURE) ×1
SUT VIC AB 3-0 CT1 27 (SUTURE) ×1
SUT VIC AB 3-0 CT1 TAPERPNT 27 (SUTURE) ×1 IMPLANT
SUT VIC AB 3-0 SH 27 (SUTURE)
SUT VIC AB 3-0 SH 27X BRD (SUTURE) IMPLANT
SUT VIC AB 4-0 KS 27 (SUTURE) ×2 IMPLANT
SYR CONTROL 10ML LL (SYRINGE) ×4 IMPLANT
TOWEL GREEN STERILE FF (TOWEL DISPOSABLE) ×4 IMPLANT
TRAY FOLEY W/BAG SLVR 14FR (SET/KITS/TRAYS/PACK) ×2 IMPLANT

## 2020-05-22 NOTE — Brief Op Note (Signed)
05/22/2020  3:08 PM  PATIENT:  Emily Robbins  40 y.o. female  PRE-OPERATIVE DIAGNOSIS:  Fibroids  POST-OPERATIVE DIAGNOSIS:  Fibroids  PROCEDURE:  Procedure(s): ABDOMINAL MYOMECTOMY (N/A)  SURGEON:  Surgeon(s) and Role:    * Lavonia Drafts, MD - Primary    * Megan Salon, MD - Assisting  ANESTHESIA:   local and general  EBL:  150 mL   BLOOD ADMINISTERED:none  DRAINS: none   LOCAL MEDICATIONS USED:  MARCAINE     SPECIMEN:  Source of Specimen:  uterine fibroids  DISPOSITION OF SPECIMEN:  PATHOLOGY  COUNTS:  YES  TOURNIQUET:  * No tourniquets in log *  DICTATION: .Note written in EPIC  PLAN OF CARE: Admit to inpatient   PATIENT DISPOSITION:  PACU - hemodynamically stable.   Delay start of Pharmacological VTE agent (>24hrs) due to surgical blood loss or risk of bleeding: yes  Complication: none immediate  Skylyn Slezak L. Harraway-Smith, M.D., Cherlynn June

## 2020-05-22 NOTE — Anesthesia Procedure Notes (Signed)
Anesthesia Regional Block: Quadratus lumborum   Pre-Anesthetic Checklist: ,, timeout performed, Correct Patient, Correct Site, Correct Laterality, Correct Procedure, Correct Position, site marked, Risks and benefits discussed,  Surgical consent,  Pre-op evaluation,  At surgeon's request and post-op pain management  Laterality: Left and Right  Prep: chloraprep       Needles:  Injection technique: Single-shot  Needle Type: Echogenic Stimulator Needle          Additional Needles:   Procedures:,,,, ultrasound used (permanent image in chart),,,,  Narrative:  Start time: 05/22/2020 12:20 PM End time: 05/22/2020 12:30 PM Injection made incrementally with aspirations every 5 mL.  Performed by: Personally  Anesthesiologist: Merlinda Frederick, MD  Additional Notes: A functioning IV was confirmed and monitors were applied.  Sterile prep and drape, hand hygiene and sterile gloves were used.  Negative aspiration and test dose prior to incremental administration of local anesthetic. The patient tolerated the procedure well.Ultrasound  guidance: relevant anatomy identified, needle position confirmed, local anesthetic spread visualized, vascular puncture avoided.  Image printed for medical record.

## 2020-05-22 NOTE — Discharge Instructions (Signed)
Myomectomy, Care After The following information offers guidance on how to care for yourself after your procedure. Your health care provider may also give you more specific instructions. If you have problems or questions, contact your health care provider. What can I expect after the procedure? After the procedure, it is common to have:  Pain in your abdomen and at the incision sites.  Vaginal bleeding. Follow these instructions at home: Medicines  Take over-the-counter and prescription medicines only as told by your health care provider.  If you were prescribed an antibiotic medicine, take it as told by your health care provider. Do not stop using the antibiotic even if you start to feel better.  If you are taking blood thinners: ? Talk with your health care provider before you take any medicines that contain aspirin or NSAIDs, such as ibuprofen. These medicines increase your risk for dangerous bleeding. ? Take your medicine exactly as told, at the same time every day. ? Avoid activities that could cause injury or bruising, and follow instructions about how to prevent falls. ? Wear a medical alert bracelet or carry a card that lists what medicines you take.  Ask your health care provider if the medicine prescribed to you: ? Requires you to avoid driving or using machinery. ? Can cause constipation. You may need to take these actions to prevent or treat constipation:  Drink enough fluid to keep your urine pale yellow.  Take over-the-counter or prescription medicines.  Eat foods that are high in fiber, such as beans, whole grains, and fresh fruits and vegetables.  Limit foods that are high in fat and processed sugars, such as fried or sweet foods. Incision care  Follow instructions from your health care provider about how to take care of any incisions. Make sure you: ? Wash your hands with soap and water for at least 20 seconds before and after you change your bandage (dressing). If  soap and water are not available, use hand sanitizer. ? Change your dressing as told by your health care provider. ? Leave stitches (sutures), skin glue, or adhesive strips in place. These skin closures may need to stay in place for 2 weeks or longer. If adhesive strip edges start to loosen and curl up, you may trim the loose edges. Do not remove adhesive strips completely unless your health care provider tells you to do that.  Check your incision areas every day for signs of infection. Check for: ? Redness, swelling, or pain. ? More fluid or blood. ? Warmth. ? Pus or a bad smell.   Activity  Do not lift anything that is heavier than 5 lb (2.3 kg), or the limit that you are told, until your health care provider says that it is safe.  Return to your normal activities as told by your health care provider. Ask your health care provider what activities are safe for you.  Rest as told by your health care provider.  Avoid sitting for a long time without moving. Get up to take short walks every 1-2 hours. This is important to improve blood flow and breathing. Ask for help if you feel weak or unsteady.  Do not douche, use tampons, or have sexual intercourse until your health care provider approves.  Practice deep breathing and coughing. If it hurts to cough, try holding a pillow against your belly as you cough.   General instructions  Do not use any products that contain nicotine or tobacco. These products include cigarettes, chewing tobacco, and vaping   devices, such as e-cigarettes. These can delay incision healing. If you need help quitting, ask your health care provider.  Do not take baths, swim, or use a hot tub until your health care provider approves. Ask your health care provider if you may take showers. You may only be allowed to take sponge baths.  Do not drink alcohol until your health care provider says it is okay.  Wear compression stockings as told by your health care provider.  These stockings help to prevent blood clots and reduce swelling in your legs.  Keep all follow-up visits. This is important. Contact a health care provider if:  You have a fever or chills.  You have more redness, swelling, or pain around your incision.  You have fluid, blood, pus, or a bad smell coming from your incision.  Your incision feels warm to the touch.  You have a rash.  You have pain when you urinate or blood in your urine.  You have nausea, vomiting, or diarrhea. Get help right away if:  You have trouble breathing or shortness of breath.  You have chest pain.  You feel weak or light-headed.  You have pain, swelling, or redness in your legs.  You become dizzy and faint.  You have heavy vaginal bleeding or blood clots that are larger than a quarter in size.  You have an incision that is opening up.  You have increasing abdominal pain that does not get better with medicine. These symptoms may represent a serious problem that is an emergency. Do not wait to see if the symptoms will go away. Get medical help right away. Call your local emergency services (911 in the [place] Do not drive yourself to the hospital. Summary  After the procedure, it is common to have pain at the incision sites.  If you were prescribed an antibiotic medicine, take it as told by your health care provider. Do not stop using the antibiotic even if you start to feel better.  Do not douche, use tampons, or have sexual intercourse until your health care provider approves.  Return to your normal activities as told by your health care provider. Ask your health care provider what activities are safe for you. This information is not intended to replace advice given to you by your health care provider. Make sure you discuss any questions you have with your health care provider. Document Revised: 08/11/2019 Document Reviewed: 08/11/2019 Elsevier Patient Education  2021 [employer]  

## 2020-05-22 NOTE — Anesthesia Preprocedure Evaluation (Addendum)
Anesthesia Evaluation  Patient identified by MRN, date of birth, ID band Patient awake    Reviewed: Allergy & Precautions, NPO status , Patient's Chart, lab work & pertinent test results  Airway Mallampati: II  TM Distance: >3 FB Neck ROM: Full    Dental no notable dental hx.    Pulmonary Current Smoker and Patient abstained from smoking.,    Pulmonary exam normal breath sounds clear to auscultation       Cardiovascular Exercise Tolerance: Good negative cardio ROS Normal cardiovascular exam Rhythm:Regular Rate:Normal     Neuro/Psych negative neurological ROS     GI/Hepatic Neg liver ROS, GERD  ,  Endo/Other  negative endocrine ROS  Renal/GU negative Renal ROS     Musculoskeletal negative musculoskeletal ROS (+)   Abdominal   Peds negative pediatric ROS (+)  Hematology  (+) anemia ,   Anesthesia Other Findings   Reproductive/Obstetrics negative OB ROS                           Anesthesia Physical Anesthesia Plan  ASA: II  Anesthesia Plan: General and Regional   Post-op Pain Management: GA combined w/ Regional for post-op pain   Induction: Intravenous  PONV Risk Score and Plan: 2 and Scopolamine patch - Pre-op, Midazolam, Treatment may vary due to age or medical condition, Ondansetron and Dexamethasone  Airway Management Planned: Oral ETT  Additional Equipment: None  Intra-op Plan:   Post-operative Plan: Extubation in OR  Informed Consent: I have reviewed the patients History and Physical, chart, labs and discussed the procedure including the risks, benefits and alternatives for the proposed anesthesia with the patient or authorized representative who has indicated his/her understanding and acceptance.       Plan Discussed with: Anesthesiologist and CRNA  Anesthesia Plan Comments: (Bilateral TAP blocks with Exparel for postop pain control. GETA. )       Anesthesia  Quick Evaluation

## 2020-05-22 NOTE — Anesthesia Procedure Notes (Signed)
Procedure Name: Intubation Date/Time: 05/22/2020 1:01 PM Performed by: Janene Harvey, CRNA Pre-anesthesia Checklist: Patient identified, Emergency Drugs available, Suction available and Patient being monitored Patient Re-evaluated:Patient Re-evaluated prior to induction Oxygen Delivery Method: Circle system utilized Preoxygenation: Pre-oxygenation with 100% oxygen Induction Type: IV induction Ventilation: Mask ventilation without difficulty Laryngoscope Size: Mac and 4 Grade View: Grade I Tube type: Oral Tube size: 7.0 mm Number of attempts: 1 Airway Equipment and Method: Stylet and Oral airway Placement Confirmation: ETT inserted through vocal cords under direct vision,  positive ETCO2 and breath sounds checked- equal and bilateral Secured at: 21 cm Tube secured with: Tape Dental Injury: Teeth and Oropharynx as per pre-operative assessment

## 2020-05-22 NOTE — H&P (Signed)
Preoperative History and Physical  Emily Robbins is a 40 y.o. G0P0000 here for surgical management of uterine fibroids.  Proposed surgery: myomectomy via laparotomy  Past Medical History:  Diagnosis Date  . GERD (gastroesophageal reflux disease)   . H/O gastroesophageal reflux (GERD)   . History of stomach ulcers   . Iron deficiency anemia 11/01/2018   Past Surgical History:  Procedure Laterality Date  . ESOPHAGOGASTRODUODENOSCOPY     to evaluate for ulcers  . OTHER SURGICAL HISTORY  2015   boil removed from buttock   OB History    Gravida  0   Para  0   Term  0   Preterm  0   AB  0   Living  0     SAB  0   IAB  0   Ectopic  0   Multiple  0   Live Births  0          Patient denies any cervical dysplasia or STIs. Medications Prior to Admission  Medication Sig Dispense Refill Last Dose  . acetaminophen (TYLENOL) 325 MG tablet Take 650 mg by mouth every 6 (six) hours as needed for moderate pain.   Past Week at Unknown time  . albuterol (VENTOLIN HFA) 108 (90 Base) MCG/ACT inhaler Inhale 2 puffs into the lungs every 6 (six) hours as needed for wheezing or shortness of breath. 8 g 1 05/21/2020 at Unknown time  . cetirizine (ZYRTEC) 10 MG tablet Take 1 tablet (10 mg total) by mouth daily. 30 tablet 5 Past Week at Unknown time  . clotrimazole (LOTRIMIN) 1 % cream Apply 1 application topically daily as needed (athlete's foot).   Past Week at Unknown time  . Fe Fum-FePoly-FA-Vit C-Vit B3 (INTEGRA F) 125-1 MG CAPS Take 1 capsule by mouth daily. 30 capsule 6 Past Week at Unknown time  . tranexamic acid (LYSTEDA) 650 MG TABS tablet Take 2 tablets (1,300 mg total) by mouth 3 (three) times daily. Take during menses for a maximum of five days 30 tablet 3 Past Month at Unknown time  . ferrous sulfate 325 (65 FE) MG tablet Take 1 tablet (325 mg total) by mouth 2 (two) times daily with a meal. (Patient not taking: Reported on 05/14/2020)  3     No Known Allergies Social  History:   reports that she has been smoking cigarettes. She started smoking about 20 years ago. She has been smoking about 0.50 packs per day. She has never used smokeless tobacco. She reports that she does not use drugs. Family History  Problem Relation Age of Onset  . Hypertension Mother   . Other Mother        borderline diabetes  . Hypertension Father   . Asthma Father   . Hyperlipidemia Father   . Hypertension Brother   . Hypertension Maternal Grandmother   . Diabetes Maternal Grandmother   . Seizures Maternal Grandmother   . Hypertension Brother     Review of Systems: Noncontributory  PHYSICAL EXAM: Blood pressure 138/77, pulse 78, temperature 97.7 F (36.5 C), temperature source Oral, resp. rate 18, height 5\' 2"  (1.575 m), weight 66.7 kg, last menstrual period 05/19/2020, SpO2 99 %. General appearance - alert, well appearing, and in no distress Chest - clear to auscultation, no wheezes, rales or rhonchi, symmetric air entry Heart - normal rate and regular rhythm Abdomen - soft, nontender, nondistended, no masses or organomegaly Pelvic -      Uterus enlarged- to the umbilicus with a  very wide base. Mobile.     Extremities - peripheral pulses normal, no pedal edema, no clubbing or cyanosis  Labs: Results for orders placed or performed during the hospital encounter of 05/22/20 (from the past 336 hour(s))  Pregnancy, urine POC   Collection Time: 05/22/20 11:45 AM  Result Value Ref Range   Preg Test, Ur NEGATIVE NEGATIVE  ABO/Rh   Collection Time: 05/22/20 11:57 AM  Result Value Ref Range   ABO/RH(D)      B POS Performed at [facility] [address] [place], [place] [address]   Results for orders placed or performed during the hospital encounter of 05/21/20 (from the past 336 hour(s))  SARS CORONAVIRUS 2 (TAT 6-24 HRS) Nasopharyngeal Nasopharyngeal Swab   Collection Time: 05/21/20  8:42 AM   Specimen: Nasopharyngeal Swab  Result Value Ref Range   SARS  Coronavirus 2 NEGATIVE NEGATIVE  Results for orders placed or performed during the hospital encounter of 05/18/20 (from the past 336 hour(s))  Type and screen   Collection Time: 05/18/20  8:54 AM  Result Value Ref Range   ABO/RH(D) B POS    Antibody Screen NEG    Sample Expiration 06/01/2020,2359    Extend sample reason      NO TRANSFUSIONS OR PREGNANCY IN THE PAST 3 MONTHS Performed at [facility], [address], [place], [place] [address]   CBC   Collection Time: 05/18/20  8:59 AM  Result Value Ref Range   WBC 7.4 4.0 - 10.5 K/uL   RBC 4.79 3.87 - 5.11 MIL/uL   Hemoglobin 13.0 12.0 - 15.0 g/dL   HCT 41.4 36.0 - 46.0 %   MCV 86.4 80.0 - 100.0 fL   MCH 27.1 26.0 - 34.0 pg   MCHC 31.4 30.0 - 36.0 g/dL   RDW 15.7 (H) 11.5 - 15.5 %   Platelets 321 150 - 400 K/uL   nRBC 0.0 0.0 - 0.2 %    Imaging Studies: 05/10/2019 CLINICAL DATA:  Suprapubic mass.  EXAM: TRANSABDOMINAL AND TRANSVAGINAL ULTRASOUND OF PELVIS  TECHNIQUE: Both transabdominal and transvaginal ultrasound examinations of the pelvis were performed. Transabdominal technique was performed for global imaging of the pelvis including uterus, ovaries, adnexal regions, and pelvic cul-de-sac. It was necessary to proceed with endovaginal exam following the transabdominal exam to visualize the endometrium and ovaries.  COMPARISON:  None  FINDINGS: Uterus  Measurements: 13.8 x 9.3 x 10.3 cm = volume: 696 mL. There is a large fibroid off the anterior left uterine fundus measuring 9.3 x 8.7 x 9.5 cm.  Endometrium  Thickness: 11 mm.  No focal abnormality visualized.  Right ovary  Measurements: 3.0 x 2.1 x 2.7 cm = volume: 8.9 mL. Normal appearance/no adnexal mass.  Left ovary  Measurements: 3.3 x 1.8 x 3.0 cm = volume: 8.8 mL. Normal appearance/no adnexal mass.  Other findings  Trace fluid in the cul-de-sac, physiologic.  IMPRESSION: 1. There is a large fibroid off the anterior left  uterine fundus, likely accounting for the palpable suprapubic mass. No other abnormalities identified. Assessment:  Patient Active Problem List   Diagnosis Date Noted  . Fibroids, intramural 05/22/2020  . Iron deficiency anemia 11/01/2018  . Allergic rhinitis 07/15/2017  . GERD (gastroesophageal reflux disease) 07/15/2017  . Stress and adjustment reaction 07/15/2017    Plan: Patient will undergo surgical management with laparotomy with myomectomy.   The risks of surgery were discussed in detail with the patient including but not limited to: bleeding which  may require transfusion or reoperation; infection which may require antibiotics; injury to surrounding organs which may involve bowel, bladder, ureters ; need for additional procedures including laparoscopy or laparotomy; thromboembolic phenomenon, surgical site problems and other postoperative/anesthesia complications. Likelihood of success in alleviating the patient's condition was discussed. Routine postoperative instructions will be reviewed with the patient and her family in detail after surgery.  The patient concurred with the proposed plan, giving informed written consent for the surgery.  Patient has been NPO since last night she will remain NPO for procedure.  Anesthesia and OR aware.  Preoperative prophylactic antibiotics and SCDs ordered on call to the OR.  To OR when ready.  Lagretta Loseke L. Ihor Dow, M.D., [facility] 05/22/2020 12:42 PM

## 2020-05-22 NOTE — Transfer of Care (Signed)
Immediate Anesthesia Transfer of Care Note  Patient: Emily Robbins  Procedure(s) Performed: ABDOMINAL MYOMECTOMY (N/A )  Patient Location: PACU  Anesthesia Type:General and Regional  Level of Consciousness: drowsy and patient cooperative  Airway & Oxygen Therapy: Patient Spontanous Breathing and Patient connected to face mask oxygen  Post-op Assessment: Report given to RN and Post -op Vital signs reviewed and stable  Post vital signs: Reviewed  Last Vitals:  Vitals Value Taken Time  BP 134/80 05/22/20 1525  Temp    Pulse 90 05/22/20 1530  Resp 11 05/22/20 1530  SpO2 100 % 05/22/20 1530  Vitals shown include unvalidated device data.  Last Pain:  Vitals:   05/22/20 1150  TempSrc:   PainSc: 0-No pain      Patients Stated Pain Goal: 2 (26/20/35 5974)  Complications: No complications documented.

## 2020-05-22 NOTE — Op Note (Signed)
05/22/2020  3:08 PM  PATIENT:  Emily Robbins  40 y.o. female  PRE-OPERATIVE DIAGNOSIS:  Fibroids  POST-OPERATIVE DIAGNOSIS:  Fibroids  PROCEDURE:  Procedure(s): ABDOMINAL MYOMECTOMY (N/A)  SURGEON:  Surgeon(s) and Role:    * Lavonia Drafts, MD - Primary    * Megan Salon, MD - Assisting  ANESTHESIA:   local and general  EBL:  150 mL   BLOOD ADMINISTERED:none  DRAINS: none   LOCAL MEDICATIONS USED:  MARCAINE     SPECIMEN:  Source of Specimen:  uterine fibroids  DISPOSITION OF SPECIMEN:  PATHOLOGY  COUNTS:  YES  TOURNIQUET:  * No tourniquets in log *  DICTATION: .Note written in EPIC  PLAN OF CARE: Admit to inpatient   PATIENT DISPOSITION:  PACU - hemodynamically stable.   Delay start of Pharmacological VTE agent (>24hrs) due to surgical blood loss or risk of bleeding: yes  Complication: none immediate  Pt is a 40 yo G0   here for abdominal myomectomy for symptomatic uterine leiomyomata. The risks of surgery were discussed with the patient including but not limited to: bleeding which may require transfusion or reoperation; infection which may require antibiotics; injury to bowel, bladder, ureters or other surrounding organs; need for additional procedures; thromboembolic phenomenon, incisional problems and other postoperative/anesthesia complications. Written informed consent was obtained.    PROCEDURE IN DETAIL:  The patient received IV preoperative antibiotics approximately 30 minutes prior to procedure.  She was then taken to the operating room and general anesthesia was administered without difficulty.  .She was placed in dorsal supine position and prepped and draped in a sterile manner.  A Foley catheter was inserted into the bladder and attached to constant drainage.  After an adequate timeout was performed, attention was then turned to the abdomen where a transverse incision was made with a scalpel.  This incision was carried down to the fascia  using electrocautery.  The fascia was incised in the midline and this incision was extended bilaterally using the Mayo scissors.  Kocher's were applied to the superior aspect of the fascial incision, and the rectus muscles were dissected off bluntly and sharply using the Mayo scissors.  A similar process was carried out at the inferior aspect of the incision.  The rectus muscles were separated in the midline bluntly and the peritoneum was identified, picked up and incised with the scalpel.  This incision was extended superiorly and inferiorly using the scalpel with good visualization of the bladder and bowel.  Attention was then turned to the uterus where multiple uterine leiomyomata were noted.  A towel clamp was used on the uterine fundus for retraction of the uterus.  Due to the size of the uterus, the rectus muscles were transected minimally to allow delivery of the uterus.  The uterus was then delivered up through the incision with the towel clamp. Attention was then turned to the uterine surface where the largest leiomyoma was identified. Vasopressin solution was injected over the surface of the leiomyoma to aid with hemostasis. A transverse incision was made over the uterine surface to the capsule of the fibroid. Using blunt and sharp methods, the leiomyoma was freed from the surrounding myometrial tissue and removed intact.  The total size of this fibroid was >640grams. Several other much smaller leiomyomata were removed in similar fashion.  After removal of all the leiomyomata which were both on the anterior and posterior aspects of the uterus, the incisions were closed in layers, initiated by using 0 Vicryl in a  running stitch.  The deeper layers were also reapproximated using 0 Vicryl running stitches, and the serosa was reapproximated using 0 Vicryl imbricating stitch.  Overall good hemostasis was noted.   The endometrium was not entered however, I RECOMMEND A CESAREAN SECTION FOR ANY PREGNANCY  MANAGEMENT/DELIVERIES due to the volume of fibroids removed. The uterus was returned to the abdomen.  The laparotomy sponges were then removed from the abdomen.  The pelvic was irrigated with warmed normal saline, and Arista was placed over the uterus for further hemostasis. The peritoneum was reapproximated using 0 vicryl.  The fascia was reapproximated with 0 Vicryl running stitch. The skin was closed with 3-0 Vicryl subcuticular stitch using a Lanny Hurst needle.  The patient tolerated the procedure well. There were no complications during this case.  Sponge, lap, needle and instrument counts were correct x2.  The patient was taken to the recovery room extubated and in stable condition.  An experienced assistant was required given the standard of surgical care given the complexity of the case.  This assistant was needed for exposure, dissection, suctioning, retraction, instrument exchange, and for overall help during the procedure.  Vitalia Stough L. Harraway-Smith, M.D., [facility]

## 2020-05-23 ENCOUNTER — Encounter (HOSPITAL_COMMUNITY): Payer: Self-pay | Admitting: Obstetrics & Gynecology

## 2020-05-23 DIAGNOSIS — D62 Acute posthemorrhagic anemia: Secondary | ICD-10-CM | POA: Diagnosis present

## 2020-05-23 LAB — CBC
HCT: 30.1 % — ABNORMAL LOW (ref 36.0–46.0)
Hemoglobin: 9.7 g/dL — ABNORMAL LOW (ref 12.0–15.0)
MCH: 27.7 pg (ref 26.0–34.0)
MCHC: 32.2 g/dL (ref 30.0–36.0)
MCV: 86 fL (ref 80.0–100.0)
Platelets: 264 10*3/uL (ref 150–400)
RBC: 3.5 MIL/uL — ABNORMAL LOW (ref 3.87–5.11)
RDW: 15.7 % — ABNORMAL HIGH (ref 11.5–15.5)
WBC: 11.2 10*3/uL — ABNORMAL HIGH (ref 4.0–10.5)
nRBC: 0 % (ref 0.0–0.2)

## 2020-05-23 MED ORDER — IBUPROFEN 600 MG PO TABS: 600.0000 mg | ORAL_TABLET | Freq: Four times a day (QID) | ORAL | 3 refills | Status: DC

## 2020-05-23 MED ORDER — DOCUSATE SODIUM 100 MG PO CAPS: 100.0000 mg | ORAL_CAPSULE | Freq: Two times a day (BID) | ORAL | 0 refills | Status: DC

## 2020-05-23 MED ORDER — FERROUS SULFATE 325 (65 FE) MG PO TABS
325.0000 mg | ORAL_TABLET | ORAL | Status: DC
  Administered 2020-05-23: 325 mg via ORAL
  Filled 2020-05-23: qty 1

## 2020-05-23 MED ORDER — OXYCODONE-ACETAMINOPHEN 5-325 MG PO TABS: 1.0000 | ORAL_TABLET | Freq: Four times a day (QID) | ORAL | 0 refills | Status: DC | PRN

## 2020-05-23 NOTE — Progress Notes (Signed)
Subjective: Patient reports tolerating PO and no problems voiding.  Pt id ambulating. She has tolerated a diet but, only a few bites. No flatus but, she does feel that she will soon have activity. She has a h/o tobacco use. She is using the incentive spirometry minimally.      Objective: I have reviewed patient's vital signs, intake and output and labs.  Intake/Output Summary (Last 24 hours) at 05/23/2020 2153 Last data filed at 05/23/2020 1049 Gross per 24 hour  Intake 1033.85 ml  Output 1100 ml  Net -66.15 ml   General: alert, cooperative and no distress GI: normal findings: soft, non-tender, abnormal findings:  hypoactive bowel sounds and incision: clean, dry and intact Extremities: extremities normal, atraumatic, no cyanosis or edema Vaginal Bleeding: none CBC Latest Ref Rng & Units 05/23/2020 05/18/2020 03/26/2020  WBC 4.0 - 10.5 K/uL 11.2(H) 7.4 7.4  Hemoglobin 12.0 - 15.0 g/dL 9.7(L) 13.0 12.8  Hematocrit 36.0 - 46.0 % 30.1(L) 41.4 39.8  Platelets 150 - 400 K/uL 264 321 342    Assessment/Plan: Pt is recovering well. She still has not had a full return of bowel function. Her pain is well controlled. Will cont to encourage reg diet and ambulation. I anticipate discharge in th am..   Dr. Harolyn Rutherford will see pt in the am.   LOS: 1 day    Emily Robbins 05/23/2020, 4:30 PM

## 2020-05-23 NOTE — Progress Notes (Signed)
1 Day Post-Op Procedure(s) (LRB): ABDOMINAL MYOMECTOMY (N/A)  Subjective: Patient reports incisional pain, tolerating PO and no problems voiding.  Ambulating without difficulty. No lightheadedness or dizziness reported. No flatus yet.  Objective: I have reviewed patient's vital signs, intake and output, medications and labs.  General: alert and no distress Resp: normal breath sounds Cardio: regular rate and rhythm GI: soft, mildly TTP; bowel sounds normal; no masses Incision: clean, dry, intact and old bloody drainage present Extremities: extremities normal, atraumatic, no cyanosis or edema and Homans sign is negative, no sign of DVT  Labs: CBC Latest Ref Rng & Units 05/23/2020 05/18/2020 03/26/2020  WBC 4.0 - 10.5 K/uL 11.2(H) 7.4 7.4  Hemoglobin 12.0 - 15.0 g/dL 9.7(L) 13.0 12.8  Hematocrit 36.0 - 46.0 % 30.1(L) 41.4 39.8  Platelets 150 - 400 K/uL 264 321 342    Assessment: s/p Procedure(s): ABDOMINAL MYOMECTOMY (N/A): stable, progressing well and tolerating diet. Mild postoperative asymptomatic anemia.  Plan: Advance diet Encourage ambulation Advance to PO medication  Oral iron therapy ordered for mild anemia Possible discharge later today by Dr. Ihor Dow.   LOS: 1 day    Verita Schneiders, MD 05/23/2020, 10:39 AM

## 2020-05-23 NOTE — Anesthesia Postprocedure Evaluation (Signed)
Anesthesia Post Note  Patient: Emily Robbins  Procedure(s) Performed: ABDOMINAL MYOMECTOMY (N/A )     Patient location during evaluation: PACU Anesthesia Type: Regional and General Level of consciousness: awake and alert Pain management: pain level controlled Vital Signs Assessment: post-procedure vital signs reviewed and stable Respiratory status: spontaneous breathing, nonlabored ventilation and respiratory function stable Cardiovascular status: blood pressure returned to baseline and stable Postop Assessment: no apparent nausea or vomiting Anesthetic complications: no   No complications documented.  Last Vitals:  Vitals:   05/23/20 0515 05/23/20 0755  BP: 114/66 (!) 110/58  Pulse: 83 81  Resp: 16 16  Temp: 36.8 C 36.6 C  SpO2: 98% 97%    Last Pain:  Vitals:   05/23/20 0755  TempSrc: Oral  PainSc:                  Merlinda Frederick

## 2020-05-24 LAB — CBC
HCT: 25.7 % — ABNORMAL LOW (ref 36.0–46.0)
Hemoglobin: 8.1 g/dL — ABNORMAL LOW (ref 12.0–15.0)
MCH: 27.5 pg (ref 26.0–34.0)
MCHC: 31.5 g/dL (ref 30.0–36.0)
MCV: 87.1 fL (ref 80.0–100.0)
Platelets: 228 10*3/uL (ref 150–400)
RBC: 2.95 MIL/uL — ABNORMAL LOW (ref 3.87–5.11)
RDW: 15.7 % — ABNORMAL HIGH (ref 11.5–15.5)
WBC: 11.9 10*3/uL — ABNORMAL HIGH (ref 4.0–10.5)
nRBC: 0 % (ref 0.0–0.2)

## 2020-05-24 LAB — SURGICAL PATHOLOGY

## 2020-05-24 MED ORDER — LORATADINE 10 MG PO TABS
10.0000 mg | ORAL_TABLET | Freq: Every day | ORAL | Status: DC
  Administered 2020-05-24: 10 mg via ORAL
  Filled 2020-05-24: qty 1

## 2020-05-24 MED ORDER — SODIUM CHLORIDE 0.9 % IV SOLN
500.0000 mg | Freq: Once | INTRAVENOUS | Status: AC
  Administered 2020-05-24: 500 mg via INTRAVENOUS
  Filled 2020-05-24: qty 25

## 2020-05-24 MED ORDER — SIMETHICONE 80 MG PO CHEW
80.0000 mg | CHEWABLE_TABLET | Freq: Four times a day (QID) | ORAL | 2 refills | Status: DC | PRN
Start: 2020-05-24 — End: 2021-06-12

## 2020-05-24 MED ORDER — SODIUM CHLORIDE 0.9 % IN NEBU
3.0000 mL | INHALATION_SOLUTION | RESPIRATORY_TRACT | Status: DC | PRN
  Filled 2020-05-24 (×2): qty 3

## 2020-05-24 NOTE — Discharge Summary (Signed)
Gynecology Physician Postoperative Discharge Summary  Patient ID: Emily Robbins MRN: [id number] DOB/AGE: August 18, 1980 40 y.o.  Admit Date: 05/22/2020 Discharge Date: 05/24/2020  Hospital Diagnoses:  Principal Problem:   S/P myomectomy Active Problems:   Iron deficiency anemia   Fibroids, intramural   Postoperative anemia due to acute blood loss  Procedures: Procedure(s) (LRB): ABDOMINAL MYOMECTOMY (N/A)  Hospital Course:  Fradel Baldonado is a 40 y.o. G0 admitted for scheduled surgery.  She underwent the procedures as mentioned above, her operation was uncomplicated. For further details about surgery, please refer to the operative report. Patient's postoperative was complicated by anemia.  She was asymptomatic, she received Venofer.  By time of discharge on POD#2, her pain was controlled on oral pain medications; she was ambulating, voiding without difficulty, tolerating regular diet and passing flatus. She was deemed stable for discharge to home with outpatient follow up.   Significant Labs: CBC Latest Ref Rng & Units 05/24/2020 05/23/2020 05/18/2020  WBC 4.0 - 10.5 K/uL 11.9(H) 11.2(H) 7.4  Hemoglobin 12.0 - 15.0 g/dL 8.1(L) 9.7(L) 13.0  Hematocrit 36.0 - 46.0 % 25.7(L) 30.1(L) 41.4  Platelets 150 - 400 K/uL 228 264 321    Discharge Exam: Blood pressure 110/65, pulse 90, temperature 98.3 F (36.8 C), temperature source Oral, resp. rate 18, height 5\' 2"  (1.575 m), weight 66.7 kg, last menstrual period 05/19/2020, SpO2 100 %. General appearance: alert and no distress  Resp: clear to auscultation bilaterally  Cardio: regular rate and rhythm  GI: soft, non-tender; bowel sounds normal; no masses, no organomegaly.  Incision: C/D/I, no erythema, old drainage noted Pelvic: scant blood on pad  Extremities: extremities normal, atraumatic, no cyanosis or edema and Homans sign is negative, no sign of DVT  Discharged Condition: Stable  Disposition: Discharge disposition: 01-Home or  Self Care       Discharge Instructions    Call MD for:  difficulty breathing, headache or visual disturbances   Complete by: As directed    Call MD for:  extreme fatigue   Complete by: As directed    Call MD for:  hives   Complete by: As directed    Call MD for:  persistant dizziness or light-headedness   Complete by: As directed    Call MD for:  persistant nausea and vomiting   Complete by: As directed    Call MD for:  redness, tenderness, or signs of infection (pain, swelling, redness, odor or green/yellow discharge around incision site)   Complete by: As directed    Call MD for:  severe uncontrolled pain   Complete by: As directed    Call MD for:  temperature >100.4   Complete by: As directed    Diet general   Complete by: As directed    Discharge wound care:   Complete by: As directed    Keep incision clean and dry.   Driving Restrictions   Complete by: As directed    No driving for 2 weeks   Increase activity slowly   Complete by: As directed    Lifting restrictions   Complete by: As directed    No heavy lifting for 6 weeks   Sexual Activity Restrictions   Complete by: As directed    Nothing in the vagina for 6 weeks     Allergies as of 05/24/2020   No Known Allergies     Medication List    STOP taking these medications   acetaminophen 325 MG tablet Commonly known as: TYLENOL   ferrous  sulfate 325 (65 FE) MG tablet   tranexamic acid 650 MG Tabs tablet Commonly known as: LYSTEDA     TAKE these medications   albuterol 108 (90 Base) MCG/ACT inhaler Commonly known as: VENTOLIN HFA Inhale 2 puffs into the lungs every 6 (six) hours as needed for wheezing or shortness of breath.   cetirizine 10 MG tablet Commonly known as: ZYRTEC Take 1 tablet (10 mg total) by mouth daily.   clotrimazole 1 % cream Commonly known as: LOTRIMIN Apply 1 application topically daily as needed (athlete's foot).   docusate sodium 100 MG capsule Commonly known as:  COLACE Take 1 capsule (100 mg total) by mouth 2 (two) times daily.   ibuprofen 600 MG tablet Commonly known as: ADVIL Take 1 tablet (600 mg total) by mouth every 6 (six) hours.   Integra F 125-1 MG Caps Take 1 capsule by mouth daily.   oxyCODONE-acetaminophen 5-325 MG tablet Commonly known as: PERCOCET/ROXICET Take 1 tablet by mouth every 6 (six) hours as needed for moderate pain or severe pain.   simethicone 80 MG chewable tablet Commonly known as: MYLICON Chew 1 tablet (80 mg total) by mouth 4 (four) times daily as needed for flatulence.            Discharge Care Instructions  (From admission, onward)         Start     Ordered   05/23/20 0000  Discharge wound care:       Comments: Keep incision clean and dry.   05/23/20 2202         Future Appointments  Date Time Provider [place]  06/07/2020  1:00 PM Lavonia Drafts, MD [facility] None  07/02/2020  8:45 AM Lavonia Drafts, MD [facility] None    Follow-up [facility] In 2 weeks.   Specialty: Obstetrics and Gynecology Contact information: [place] [address] [phone]              Total discharge time: 15 minutes   Signed:  Verita Schneiders, MD, [facility] [place], [facility]

## 2020-06-07 ENCOUNTER — Other Ambulatory Visit: Payer: Self-pay

## 2020-06-07 ENCOUNTER — Ambulatory Visit (INDEPENDENT_AMBULATORY_CARE_PROVIDER_SITE_OTHER): Payer: BC Managed Care – PPO | Admitting: Obstetrics & Gynecology

## 2020-06-07 ENCOUNTER — Encounter: Payer: Self-pay | Admitting: Obstetrics & Gynecology

## 2020-06-07 VITALS — BP 115/64 | HR 99 | Ht 62.0 in | Wt 142.0 lb

## 2020-06-07 DIAGNOSIS — Z9889 Other specified postprocedural states: Secondary | ICD-10-CM

## 2020-06-07 NOTE — Progress Notes (Signed)
Pt presents today for 2 week PO following abdominal myomectomy on 05/22/20. Pt states she is doing well since surgery.

## 2020-06-07 NOTE — Progress Notes (Signed)
History:  40 y.o.LMP here today for 2 week post op check.Pt is s/p abdominal myomectomy on 05/22/2020.  Pt reports that she is doing well. She is eating and passing stools without difficulty.   She reports some soreness but, reports that her pain is well controlled.   The following portions of the patient's history were reviewed and updated as appropriate: allergies, current medications, past family history, past medical history, past social history, past surgical history and problem list.  Review of Systems:  Pertinent items are noted in HPI.    Objective:  Physical Exam BP 115/64 (BP Location: Right Arm, Patient Position: Sitting, Cuff Size: Normal)   Pulse 99   Ht 5\' 2"  (1.575 m)   Wt 142 lb (64.4 kg)   LMP 05/19/2020   BMI 25.97 kg/m   CONSTITUTIONAL: Well-developed, well-nourished female in no acute distress.  HENT:  Normocephalic, atraumatic EYES: Conjunctivae and EOM are normal. No scleral icterus.  NECK: Normal range of motion SKIN: Skin is warm and dry. No rash noted. Not diaphoretic.No pallor. [place]: Alert and oriented to person, place, and time. Normal coordination.  Abd: Soft, nontender and nondistended; her dressing was still in place including the honeycomb and the steristrips. These were removed. The incision is clean dry and intact.  Pelvic: deferred  Labs and Imaging Surg path 05/22/2020 FINAL MICROSCOPIC DIAGNOSIS:   A. UTERINE FIBROIDS, MYOMECTOMY:  - Benign leiomyomata.    Assessment & Plan:  2 week post op check following abdominal myomectmy   Doing well  Reviewed her surg path.   Reviewed post op instructions and activities  Gradual increase in activities  F/u in 3 weeks or sooner prn  All questions answered.   Surah Pelley L. Harraway-Smith, M.D., [facility]

## 2020-06-12 ENCOUNTER — Telehealth: Payer: Self-pay | Admitting: Family

## 2020-06-12 ENCOUNTER — Encounter: Payer: Self-pay | Admitting: Family

## 2020-06-12 NOTE — Telephone Encounter (Signed)
Patient states she was seen on 12/13/19 for tobacco abuse. It was filed as an office visit instead of sustainable tobacco. Because of the filing, patient is now facing an increase on her insurance policy. Patient is wondering if it could be re-filed with the correct coding.

## 2020-06-12 NOTE — Telephone Encounter (Signed)
Dawn,   Please see pt's message.  Do you think she means that I need to bill cessation counseling 5 minutes etc.?

## 2020-06-12 NOTE — Telephone Encounter (Signed)
I spoke with patient and gave her instructions on where to go in her her Mychart to locate the encounter note so she can view it and print it from that DOS.  After speaking with the Eligibility and Enrollment representative at [facility], it sounds like patient needs to present documentation where smoking cessation was discussed and the encounter provides that information in detail vs. The AVS the patient had previously presented to them.  I advised patient that if for any reason she had any issues printing the document or finding the document to please contact me and I would assist her.

## 2020-07-02 ENCOUNTER — Encounter: Payer: Self-pay | Admitting: General Practice

## 2020-07-02 ENCOUNTER — Encounter: Payer: Self-pay | Admitting: Obstetrics & Gynecology

## 2020-07-02 ENCOUNTER — Ambulatory Visit (INDEPENDENT_AMBULATORY_CARE_PROVIDER_SITE_OTHER): Payer: BC Managed Care – PPO | Admitting: Obstetrics & Gynecology

## 2020-07-02 ENCOUNTER — Other Ambulatory Visit: Payer: Self-pay

## 2020-07-02 VITALS — BP 121/81 | HR 86 | Wt 145.0 lb

## 2020-07-02 DIAGNOSIS — Z9889 Other specified postprocedural states: Secondary | ICD-10-CM

## 2020-07-02 NOTE — Progress Notes (Signed)
History:  40 y.o.LMP here today for 6 week post op check.Pt is s/p Abd myomectomy on 05/22/2020.  Pt reports that she is doing well. She is eating and passing stools without difficulty.   Pt reports that she does have pain with pressing on her abd. She is walking and performing all other activities. She is scheduled to RTW in 4 days.   The following portions of the patient's history were reviewed and updated as appropriate: allergies, current medications, past family history, past medical history, past social history, past surgical history and problem list.  Review of Systems:  Pertinent items are noted in HPI.    Objective:  Physical Exam BP 121/81   Pulse 86   Wt 145 lb (65.8 kg)   BMI 26.52 kg/m   CONSTITUTIONAL: Well-developed, well-nourished female in no acute distress.  HENT:  Normocephalic, atraumatic EYES: Conjunctivae and EOM are normal. No scleral icterus.  NECK: Normal range of motion SKIN: Skin is warm and dry. No rash noted. Not diaphoretic.No pallor. [place]: Alert and oriented to person, place, and time. Normal coordination.  Abd: Soft, nondistended; there is tenderness to palpation over her uterus. The incision is clean, dry and intact.   Pelvic: deferred  Assessment & Plan:  6 week post op check following abd myomectomy. Still with tenderness over uterus. Normal at this point for the size of her fibroids.   Doing well  Reviewed post op instructions and activities  Gradual increase in activities  F/u in 3 months or sooner prn  Return to full activities. No lifting >30# for 4 more weeks.   Rec no pregnancy for > 1 year.   All questions answered.   Monroe Toure L. Harraway-Smith, M.D., Cherlynn June

## 2020-07-19 ENCOUNTER — Encounter: Payer: Self-pay | Admitting: General Practice

## 2020-07-20 ENCOUNTER — Telehealth: Payer: Self-pay

## 2020-07-20 NOTE — Telephone Encounter (Signed)
Kia from [employer] called wanting to know when pt was cleared to go back to work. I explained to Kia that as of 07/19/20 a letter was generated for pt to return to work with no restrictions. Understanding was voiced. Lalena Salas l Lawyer Washabaugh, CMA

## 2020-07-25 ENCOUNTER — Other Ambulatory Visit: Payer: Self-pay | Admitting: Family

## 2020-08-20 ENCOUNTER — Other Ambulatory Visit: Payer: Self-pay | Admitting: Family

## 2021-01-16 ENCOUNTER — Encounter: Payer: Self-pay | Admitting: Obstetrics & Gynecology

## 2021-02-25 ENCOUNTER — Encounter: Payer: Self-pay | Admitting: Obstetrics & Gynecology

## 2021-02-25 ENCOUNTER — Other Ambulatory Visit: Payer: Self-pay

## 2021-02-25 ENCOUNTER — Other Ambulatory Visit (HOSPITAL_COMMUNITY): Payer: Self-pay

## 2021-02-25 DIAGNOSIS — N920 Excessive and frequent menstruation with regular cycle: Secondary | ICD-10-CM

## 2021-02-25 DIAGNOSIS — D500 Iron deficiency anemia secondary to blood loss (chronic): Secondary | ICD-10-CM

## 2021-02-25 DIAGNOSIS — D219 Benign neoplasm of connective and other soft tissue, unspecified: Secondary | ICD-10-CM

## 2021-02-25 MED ORDER — INTEGRA F 125-1 MG PO CAPS
1.0000 | ORAL_CAPSULE | Freq: Every day | ORAL | 6 refills | Status: DC
  Filled 2021-02-25 – 2021-06-12 (×2): qty 30, 30d supply, fill #0

## 2021-02-27 ENCOUNTER — Other Ambulatory Visit (HOSPITAL_COMMUNITY): Payer: Self-pay

## 2021-03-01 ENCOUNTER — Other Ambulatory Visit (HOSPITAL_COMMUNITY): Payer: Self-pay

## 2021-03-05 ENCOUNTER — Other Ambulatory Visit: Payer: Self-pay | Admitting: Family

## 2021-03-07 ENCOUNTER — Other Ambulatory Visit (HOSPITAL_COMMUNITY): Payer: Self-pay

## 2021-06-07 ENCOUNTER — Encounter: Payer: Self-pay | Admitting: Family

## 2021-06-12 ENCOUNTER — Other Ambulatory Visit (HOSPITAL_COMMUNITY): Payer: Self-pay

## 2021-06-12 ENCOUNTER — Other Ambulatory Visit (HOSPITAL_COMMUNITY)
Admission: RE | Admit: 2021-06-12 | Discharge: 2021-06-12 | Disposition: A | Discharge status: Home / Self Care | Payer: Commercial Managed Care - PPO | Source: Ambulatory Visit | Attending: Family | Admitting: Family

## 2021-06-12 ENCOUNTER — Ambulatory Visit: Payer: Commercial Managed Care - PPO | Admitting: Family

## 2021-06-12 VITALS — BP 124/87 | HR 75 | Temp 98.7°F | Resp 16 | Wt 139.0 lb

## 2021-06-12 DIAGNOSIS — Z113 Encounter for screening for infections with a predominantly sexual mode of transmission: Secondary | ICD-10-CM

## 2021-06-12 DIAGNOSIS — N898 Other specified noninflammatory disorders of vagina: Secondary | ICD-10-CM | POA: Diagnosis present

## 2021-06-12 NOTE — Patient Instructions (Signed)
Please complete lab work prior to leaving.   

## 2021-06-12 NOTE — Progress Notes (Signed)
Subjective:     Patient ID: Emily Robbins, female    DOB: 1980/05/17, 41 y.o.   MRN: [id number]  Chief Complaint  Patient presents with   Vaginal Discharge    Complains of light discharge after periods with suprapubic pain.     Vaginal Discharge The patient's primary symptoms include vaginal discharge.  Patient is in today with c/o some suprapubic cramping.  Some vaginal itching. Had a new female partner recently.    Health Maintenance Due  Topic Date Due   Hepatitis C Screening  Never done   COVID-19 Vaccine (3 - Booster for Moderna series) 07/29/2019    Past Medical History:  Diagnosis Date   GERD (gastroesophageal reflux disease)    H/O gastroesophageal reflux (GERD)    History of stomach ulcers    Iron deficiency anemia 11/01/2018    Past Surgical History:  Procedure Laterality Date   ESOPHAGOGASTRODUODENOSCOPY     to evaluate for ulcers   MYOMECTOMY N/A 05/22/2020   Procedure: ABDOMINAL MYOMECTOMY;  Surgeon: Lavonia Drafts, MD;  Location: [place];  Service: Gynecology;  Laterality: N/A;   OTHER SURGICAL HISTORY  2015   boil removed from buttock    Family History  Problem Relation Age of Onset   Hypertension Mother    Other Mother        borderline diabetes   Hypertension Father    Asthma Father    Hyperlipidemia Father    Hypertension Brother    Hypertension Maternal Grandmother    Diabetes Maternal Grandmother    Seizures Maternal Grandmother    Hypertension Brother     Social History   Socioeconomic History   Marital status: Single    Spouse name: Not on file   Number of children: Not on file   Years of education: Not on file   Highest education level: Not on file  Occupational History   Not on file  Tobacco Use   Smoking status: Every Day    Packs/day: 0.50    Types: Cigarettes    Start date: 01/21/2000   Smokeless tobacco: Never  Vaping Use   Vaping Use: Former  Substance and Sexual Activity   Alcohol use: Not on file     Comment: occasional   Drug use: Never   Sexual activity: Yes    Partners: Female  Other Topics Concern   Not on file  Social History Narrative   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   Was raised by her grandmother   Lives with female partner   Social Determinants of Health   Financial Resource Strain: Not on file  Food Insecurity: Not on file  Transportation Needs: Not on file  Physical Activity: Not on file  Stress: Not on file  Social Connections: Not on file  Intimate Partner Violence: Not on file    Outpatient Medications Prior to Visit  Medication Sig Dispense Refill   albuterol (VENTOLIN HFA) 108 (90 Base) MCG/ACT inhaler TAKE 2 PUFFS BY MOUTH EVERY 6 HOURS AS NEEDED FOR WHEEZE OR SHORTNESS OF BREATH 8.5 each 1   cetirizine (ZYRTEC) 10 MG tablet TAKE 1 TABLET BY MOUTH EVERY DAY 90 tablet 1   clotrimazole (LOTRIMIN) 1 % cream Apply 1 application topically daily as needed (athlete's foot).     Fe Fum-FePoly-FA-Vit C-Vit B3 (INTEGRA F) 125-1 MG CAPS Take 1 capsule by mouth daily. 30 capsule 6   ibuprofen (ADVIL) 600 MG tablet  Take 1 tablet (600 mg total) by mouth every 6 (six) hours. (Patient not taking: Reported on 07/02/2020) 30 tablet 3   docusate sodium (COLACE) 100 MG capsule Take 1 capsule (100 mg total) by mouth 2 (two) times daily. (Patient not taking: Reported on 07/02/2020) 10 capsule 0   oxyCODONE-acetaminophen (PERCOCET/ROXICET) 5-325 MG tablet Take 1 tablet by mouth every 6 (six) hours as needed for moderate pain or severe pain. (Patient not taking: Reported on 07/02/2020) 30 tablet 0   simethicone (MYLICON) 80 MG chewable tablet Chew 1 tablet (80 mg total) by mouth 4 (four) times daily as needed for flatulence. 30 tablet 2   No facility-administered medications prior to visit.    No Known Allergies  Review of Systems  Genitourinary:  Positive for vaginal discharge.     See HPI  Objective:    Physical  Exam Constitutional:      General: She is not in acute distress.    Appearance: Normal appearance. She is well-developed.  HENT:     Head: Normocephalic and atraumatic.     Right Ear: External ear normal.     Left Ear: External ear normal.  Eyes:     General: No scleral icterus. Neck:     Thyroid: No thyromegaly.  Cardiovascular:     Rate and Rhythm: Normal rate and regular rhythm.     Heart sounds: Normal heart sounds. No murmur heard. Pulmonary:     Effort: Pulmonary effort is normal. No respiratory distress.     Breath sounds: Normal breath sounds. No wheezing.  Genitourinary:    General: Normal vulva.     Exam position: Lithotomy position.     Labia:        Right: No rash.        Left: No rash.      Vagina: Normal.     Cervix: No cervical motion tenderness.     Uterus: Normal.      Adnexa:        Right: No mass or tenderness.         Left: No mass or tenderness.    Musculoskeletal:     Cervical back: Neck supple.  Skin:    General: Skin is warm and dry.  Neurological:     Mental Status: She is alert and oriented to person, place, and time.  Psychiatric:        Mood and Affect: Mood normal.        Behavior: Behavior normal.        Thought Content: Thought content normal.        Judgment: Judgment normal.    BP 124/87 (BP Location: Right Arm, Patient Position: Sitting, Cuff Size: Small)   Pulse 75   Temp 98.7 F (37.1 C) (Oral)   Resp 16   Wt 139 lb (63 kg)   SpO2 100%   BMI 25.42 kg/m  Wt Readings from Last 3 Encounters:  06/12/21 139 lb (63 kg)  07/02/20 145 lb (65.8 kg)  06/07/20 142 lb (64.4 kg)       Assessment & Plan:   Problem List Items Addressed This Visit   None Visit Diagnoses     Vaginal discharge    -  Primary   Relevant Orders   Urine Culture   Cervicovaginal ancillary only( [place])   Screening examination for STD (sexually transmitted disease)       Relevant Orders   RPR   HIV antibody (with reflex)   Hepatitis B Surface  AntiGEN   HSV 2 antibody, IgG       I have discontinued Kaelen L. Daye "O'Tika"'s oxyCODONE-acetaminophen, docusate sodium, and simethicone. I am also having her maintain her clotrimazole, ibuprofen, albuterol, Integra F, and cetirizine.  No orders of the defined types were placed in this encounter.

## 2021-06-13 ENCOUNTER — Other Ambulatory Visit (HOSPITAL_COMMUNITY): Payer: Self-pay

## 2021-06-13 ENCOUNTER — Encounter: Payer: Self-pay | Admitting: Family

## 2021-06-13 LAB — CERVICOVAGINAL ANCILLARY ONLY
Bacterial Vaginitis (gardnerella): NEGATIVE
Candida Glabrata: NEGATIVE
Candida Vaginitis: POSITIVE — AB
Chlamydia: NEGATIVE
Comment: NEGATIVE
Comment: NEGATIVE
Comment: NEGATIVE
Comment: NEGATIVE
Comment: NEGATIVE
Comment: NORMAL
Neisseria Gonorrhea: NEGATIVE
Trichomonas: NEGATIVE

## 2021-06-13 LAB — URINE CULTURE
MICRO NUMBER:: 13439575
SPECIMEN QUALITY:: ADEQUATE

## 2021-06-14 ENCOUNTER — Telehealth: Payer: Self-pay | Admitting: Family

## 2021-06-14 ENCOUNTER — Encounter: Payer: Self-pay | Admitting: Family

## 2021-06-14 ENCOUNTER — Telehealth: Payer: Self-pay

## 2021-06-14 ENCOUNTER — Other Ambulatory Visit (HOSPITAL_COMMUNITY): Payer: Self-pay

## 2021-06-14 ENCOUNTER — Other Ambulatory Visit: Payer: Self-pay

## 2021-06-14 DIAGNOSIS — Z8619 Personal history of other infectious and parasitic diseases: Secondary | ICD-10-CM | POA: Insufficient documentation

## 2021-06-14 DIAGNOSIS — B009 Herpesviral infection, unspecified: Secondary | ICD-10-CM

## 2021-06-14 MED ORDER — VALACYCLOVIR HCL 500 MG PO TABS
500.0000 mg | ORAL_TABLET | Freq: Every day | ORAL | 1 refills | Status: DC
  Filled 2021-06-14: qty 90, 90d supply, fill #0

## 2021-06-14 MED ORDER — FLUCONAZOLE 150 MG PO TABS
ORAL_TABLET | ORAL | 0 refills | Status: DC
  Filled 2021-06-14: qty 2, 3d supply, fill #0

## 2021-06-14 MED ORDER — FLUCONAZOLE 150 MG PO TABS: ORAL_TABLET | ORAL | 0 refills | Status: DC

## 2021-06-14 MED ORDER — VALACYCLOVIR HCL 500 MG PO TABS: 500.0000 mg | ORAL_TABLET | Freq: Every day | ORAL | 1 refills | Status: DC

## 2021-06-14 NOTE — Telephone Encounter (Signed)
Pcp called patient with results

## 2021-06-14 NOTE — Telephone Encounter (Signed)
Prescriptions cancelled at [facility] and sent to [facility] long outpatient pharmacy. Message sent to patient about this

## 2021-06-14 NOTE — Telephone Encounter (Signed)
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 Titer 1:1.   Vaginal Candidiasis- rx with diflucan.   Urine culture negative, Hep B antigen negative.   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 She is interested in suppressive therapy to decrease risk of transmission to others/future outbreaks. Rx sent for valtrex '500mg'$  once daily.   Reviewed above with pt.  She verbalizes understanding. Discussed safe sex.

## 2021-06-14 NOTE — Telephone Encounter (Signed)
Patient would like her Valtrex and Diflucan to be sent to [facility] long outpatient pharmacy instead. Please advice.

## 2021-06-14 NOTE — Telephone Encounter (Signed)
Initial Comment Caller states she is looking for her test results. Translation No Disp. Time Eilene Ghazi Time) Disposition Final User 06/14/2021 7:39:16 AM Clinical Call Yes Kirk Ruths, RN, [place] [facility] Comments User: Tawni Levy, RN Date/Time Eilene Ghazi Time): 06/14/2021 7:40:12 AM Caller looking for test results no worse or new s/s for triage advised to call back when office is open for test result

## 2021-06-15 LAB — HSV 2 ANTIBODY, IGG: HSV 2 Glycoprotein G Ab, IgG: 2.14 index — ABNORMAL HIGH

## 2021-06-15 LAB — RPR TITER: RPR Titer: 1:1 {titer} — ABNORMAL HIGH

## 2021-06-15 LAB — HEPATITIS B SURFACE ANTIGEN: Hepatitis B Surface Ag: NONREACTIVE

## 2021-06-15 LAB — RPR: RPR Ser Ql: REACTIVE — AB

## 2021-06-15 LAB — FLUORESCENT TREPONEMAL AB(FTA)-IGG-BLD: Fluorescent Treponemal ABS: NONREACTIVE

## 2021-06-15 LAB — HIV ANTIBODY (ROUTINE TESTING W REFLEX): HIV 1&2 Ab, 4th Generation: NONREACTIVE

## 2021-06-26 ENCOUNTER — Other Ambulatory Visit (HOSPITAL_COMMUNITY): Payer: Self-pay

## 2021-07-10 ENCOUNTER — Ambulatory Visit: Payer: BC Managed Care – PPO | Admitting: Obstetrics & Gynecology

## 2021-07-12 ENCOUNTER — Other Ambulatory Visit (HOSPITAL_COMMUNITY): Payer: Self-pay

## 2021-07-12 ENCOUNTER — Encounter (INDEPENDENT_AMBULATORY_CARE_PROVIDER_SITE_OTHER): Payer: Commercial Managed Care - PPO | Admitting: Family

## 2021-07-12 DIAGNOSIS — K047 Periapical abscess without sinus: Secondary | ICD-10-CM | POA: Diagnosis not present

## 2021-07-12 MED ORDER — AMOXICILLIN 500 MG PO CAPS
500.0000 mg | ORAL_CAPSULE | Freq: Three times a day (TID) | ORAL | 0 refills | Status: AC
  Filled 2021-07-12: qty 30, 10d supply, fill #0

## 2021-07-13 ENCOUNTER — Other Ambulatory Visit (HOSPITAL_COMMUNITY): Payer: Self-pay

## 2021-07-27 ENCOUNTER — Other Ambulatory Visit (HOSPITAL_COMMUNITY): Payer: Self-pay

## 2021-09-11 ENCOUNTER — Encounter (INDEPENDENT_AMBULATORY_CARE_PROVIDER_SITE_OTHER): Payer: Commercial Managed Care - PPO | Admitting: Family

## 2021-09-11 ENCOUNTER — Other Ambulatory Visit: Payer: Self-pay | Admitting: Family

## 2021-09-11 DIAGNOSIS — D500 Iron deficiency anemia secondary to blood loss (chronic): Secondary | ICD-10-CM

## 2021-09-11 DIAGNOSIS — N760 Acute vaginitis: Secondary | ICD-10-CM | POA: Diagnosis not present

## 2021-09-11 DIAGNOSIS — D219 Benign neoplasm of connective and other soft tissue, unspecified: Secondary | ICD-10-CM

## 2021-09-11 DIAGNOSIS — N920 Excessive and frequent menstruation with regular cycle: Secondary | ICD-10-CM

## 2021-09-12 ENCOUNTER — Other Ambulatory Visit: Payer: Self-pay

## 2021-09-12 MED ORDER — FLUCONAZOLE 150 MG PO TABS: ORAL_TABLET | ORAL | 0 refills | Status: DC

## 2021-09-12 MED ORDER — INTEGRA F 125-1 MG PO CAPS: 1.0000 | ORAL_CAPSULE | Freq: Every day | ORAL | 6 refills | Status: DC

## 2021-09-12 NOTE — Telephone Encounter (Signed)

## 2021-10-31 ENCOUNTER — Encounter: Payer: Self-pay | Admitting: Family

## 2021-10-31 DIAGNOSIS — D219 Benign neoplasm of connective and other soft tissue, unspecified: Secondary | ICD-10-CM

## 2021-10-31 DIAGNOSIS — N920 Excessive and frequent menstruation with regular cycle: Secondary | ICD-10-CM

## 2021-10-31 DIAGNOSIS — D500 Iron deficiency anemia secondary to blood loss (chronic): Secondary | ICD-10-CM

## 2021-11-01 MED ORDER — CETIRIZINE HCL 10 MG PO TABS: 10.0000 mg | ORAL_TABLET | Freq: Every day | ORAL | 1 refills | Status: DC

## 2021-11-01 MED ORDER — INTEGRA F 125-1 MG PO CAPS: 1.0000 | ORAL_CAPSULE | Freq: Every day | ORAL | 6 refills | Status: DC

## 2021-11-12 ENCOUNTER — Ambulatory Visit: Payer: BC Managed Care – PPO | Admitting: Family

## 2021-11-12 VITALS — BP 121/82 | HR 87 | Temp 98.2°F | Resp 16 | Wt 142.0 lb

## 2021-11-12 DIAGNOSIS — F17210 Nicotine dependence, cigarettes, uncomplicated: Secondary | ICD-10-CM

## 2021-11-12 DIAGNOSIS — Z720 Tobacco use: Secondary | ICD-10-CM

## 2021-11-12 DIAGNOSIS — F4329 Adjustment disorder with other symptoms: Secondary | ICD-10-CM | POA: Diagnosis not present

## 2021-11-12 DIAGNOSIS — N760 Acute vaginitis: Secondary | ICD-10-CM | POA: Diagnosis not present

## 2021-11-12 MED ORDER — VARENICLINE TARTRATE (STARTER) 0.5 MG X 11 & 1 MG X 42 PO TBPK: ORAL_TABLET | ORAL | 0 refills | Status: DC

## 2021-11-12 NOTE — Assessment & Plan Note (Signed)
Continues to grieve the loss of her grandmother.  She is working on finding a Engineer, production.

## 2021-11-12 NOTE — Assessment & Plan Note (Signed)
She has not yet set a quit date. She is willing to try chantix. Trial of chantix. Common side effects including rare risk of worsening depression was discussed with the patient today.  Patient is instructed to go directly to the ED if this occurs.  We discussed that patient can continue to smoke for 1 week after starting chantix, but then must discontinue cigarettes.  He is also instructed to contact us prior to completion of the starter month pack for an rx for the continuation month pack.  5 minutes spent with patient today on tobacco cessation counseling.

## 2021-11-12 NOTE — Progress Notes (Signed)
Subjective:   By signing my name below, I, Emily Robbins, attest that this documentation has been prepared under the direction and in the presence of Emily Chimera, NP 11/12/2021   Patient ID: Emily Robbins, female    DOB: [birth year], 41 y.o.   MRN: [id number]  Chief Complaint  Patient presents with   Nicotine Dependence    Here to discuss smoking cessation   Vaginitis    Will like to discuss recurrent yeast infections    HPI Patient is in today for an office visit   Occupation: She reports that she recently switched occupations and is now working as a Tourist information centre manager Infection: She has decided to change her diet and is now using process of elimination with her soap detergents. She regularly wears cotton underwear. She is not experiencing symptoms at this current moment.   Smoking: She states that her smoking is increasing. She is currently smoking about one pack daily. She believes that she is smoking more frequently due to death within in her family. She reports that her grandmother passed last December. She is currently in therapy. She has not tried Chantix in the past. She is scared to quit because smoking offers a form of comfort for her.  Immunizations: She is not interested in receiving an influenza vaccine at the moment.   Health Maintenance Due  Topic Date Due   Hepatitis C Screening  Never done   COVID-19 Vaccine (3 - Moderna series) 07/29/2019   INFLUENZA VACCINE  Never done   PAP SMEAR-Modifier  12/08/2021    Past Medical History:  Diagnosis Date   GERD (gastroesophageal reflux disease)    H/O gastroesophageal reflux (GERD)    Herpes simplex type 2 infection    History of stomach ulcers    History of syphilis    Iron deficiency anemia 11/01/2018    Past Surgical History:  Procedure Laterality Date   ESOPHAGOGASTRODUODENOSCOPY     to evaluate for ulcers   MYOMECTOMY N/A 05/22/2020   Procedure: ABDOMINAL MYOMECTOMY;  Surgeon:  Lavonia Drafts, MD;  Location: [place];  Service: Gynecology;  Laterality: N/A;   OTHER SURGICAL HISTORY  2015   boil removed from buttock    Family History  Problem Relation Age of Onset   Hypertension Mother    Other Mother        borderline diabetes   Hypertension Father    Asthma Father    Hyperlipidemia Father    Hypertension Brother    Hypertension Maternal Grandmother    Diabetes Maternal Grandmother    Seizures Maternal Grandmother    Hypertension Brother     Social History   Socioeconomic History   Marital status: Single    Spouse name: Not on file   Number of children: Not on file   Years of education: Not on file   Highest education level: Not on file  Occupational History   Not on file  Tobacco Use   Smoking status: Every Day    Packs/day: 0.50    Types: Cigarettes    Start date: 01/21/2000   Smokeless tobacco: Never  Vaping Use   Vaping Use: Former  Substance and Sexual Activity   Alcohol use: Not on file    Comment: occasional   Drug use: Never   Sexual activity: Yes    Partners: Female  Other Topics Concern   Not on file  Social History Narrative   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   Grew  up In [place] online   Was raised by her grandmother   Lives with female partner   Social Determinants of Health   Financial Resource Strain: Not on file  Food Insecurity: Not on file  Transportation Needs: Not on file  Physical Activity: Not on file  Stress: Not on file  Social Connections: Not on file  Intimate Partner Violence: Not on file    Outpatient Medications Prior to Visit  Medication Sig Dispense Refill   albuterol (VENTOLIN HFA) 108 (90 Base) MCG/ACT inhaler TAKE 2 PUFFS BY MOUTH EVERY 6 HOURS AS NEEDED FOR WHEEZE OR SHORTNESS OF BREATH 8.5 each 1   cetirizine (ZYRTEC) 10 MG tablet Take 1 tablet (10 mg total) by mouth daily. 90 tablet 1   clotrimazole (LOTRIMIN) 1 % cream Apply 1 application topically  daily as needed (athlete's foot).     Fe Fum-FePoly-FA-Vit C-Vit B3 (INTEGRA F) 125-1 MG CAPS Take 1 capsule by mouth daily. 30 capsule 6   ibuprofen (ADVIL) 600 MG tablet Take 1 tablet (600 mg total) by mouth every 6 (six) hours. 30 tablet 3   valACYclovir (VALTREX) 500 MG tablet Take 1 tablet (500 mg total) by mouth daily. 90 tablet 1   fluconazole (DIFLUCAN) 150 MG tablet Take 1 tablet by mouth today. May repeat in 3 days if symptoms persist. (Patient not taking: Reported on 11/12/2021) 2 tablet 0   No facility-administered medications prior to visit.    No Known Allergies  ROS    See HPI Objective:    Physical Exam Constitutional:      General: She is not in acute distress.    Appearance: Normal appearance. She is not ill-appearing.  HENT:     Head: Normocephalic and atraumatic.     Right Ear: External ear normal.     Left Ear: External ear normal.  Eyes:     Extraocular Movements: Extraocular movements intact.     Pupils: Pupils are equal, round, and reactive to light.  Cardiovascular:     Rate and Rhythm: Normal rate and regular rhythm.     Heart sounds: Normal heart sounds. No murmur heard.    No gallop.  Pulmonary:     Effort: Pulmonary effort is normal. No respiratory distress.     Breath sounds: Normal breath sounds. No wheezing or rales.  Skin:    General: Skin is warm and dry.  Neurological:     Mental Status: She is alert and oriented to person, place, and time.  Psychiatric:        Mood and Affect: Mood normal.        Behavior: Behavior normal.        Judgment: Judgment normal.     BP 121/82 (BP Location: Right Arm, Patient Position: Sitting, Cuff Size: Small)   Pulse 87   Temp 98.2 F (36.8 C) (Oral)   Resp 16   Wt 142 lb (64.4 kg)   SpO2 100%   BMI 25.97 kg/m  Wt Readings from Last 3 Encounters:  11/12/21 142 lb (64.4 kg)  06/12/21 139 lb (63 kg)  07/02/20 145 lb (65.8 kg)       Assessment & Plan:   Problem List Items Addressed This Visit        Unprioritized   Tobacco abuse    She has not yet set a quit date. She is willing to try chantix. Trial of chantix. Common side effects including rare risk of worsening depression was discussed with  the patient today.  Patient is instructed to go directly to the ED if this occurs.  We discussed that patient can continue to smoke for 1 week after starting chantix, but then must discontinue cigarettes.  He is also instructed to contact us prior to completion of the starter month pack for an rx for the continuation month pack.  5 minutes spent with patient today on tobacco cessation counseling.        Stress and adjustment reaction    Continues to grieve the loss of her grandmother.  She is working on finding a new therapist.       Recurrent vaginitis - Primary    No symptoms currently. We did discuss switching to All Free and Clear Detergent, white cotton underwear, yogurt, rest.  Follow up as needed.       Meds ordered this encounter  Medications   Varenicline Tartrate, Starter, (CHANTIX STARTING MONTH PAK) 0.5 MG X 11 & 1 MG X 42 TBPK    Sig: Use per package instructions.    Dispense:  53 each    Refill:  0    Order Specific Question:   Supervising Provider    Answer:   Penni Homans A [[id number]]    I, Nance Pear, NP, personally preformed the services described in this documentation.  All medical record entries made by the scribe were at my direction and in my presence.  I have reviewed the chart and discharge instructions (if applicable) and agree that the record reflects my personal performance and is accurate and complete. 11/12/2021   I,Amber Collins,acting as a scribe for Nance Pear, NP.,have documented all relevant documentation on the behalf of Nance Pear, NP,as directed by  Nance Pear, NP while in the presence of Nance Pear, NP.    Nance Pear, NP

## 2021-11-12 NOTE — Assessment & Plan Note (Signed)
No symptoms currently. We did discuss switching to All Free and Clear Detergent, white cotton underwear, yogurt, rest.  Follow up as needed.

## 2021-11-22 ENCOUNTER — Encounter (INDEPENDENT_AMBULATORY_CARE_PROVIDER_SITE_OTHER): Payer: BC Managed Care – PPO | Admitting: Family

## 2021-11-22 DIAGNOSIS — K047 Periapical abscess without sinus: Secondary | ICD-10-CM

## 2021-11-24 DIAGNOSIS — K047 Periapical abscess without sinus: Secondary | ICD-10-CM | POA: Diagnosis not present

## 2021-11-27 MED ORDER — AMOXICILLIN 500 MG PO CAPS: 500.0000 mg | ORAL_CAPSULE | Freq: Three times a day (TID) | ORAL | 0 refills | Status: AC

## 2021-11-27 NOTE — Telephone Encounter (Signed)

## 2021-11-27 NOTE — Addendum Note (Signed)
Addended by: Debbrah Alar on: 11/27/2021 12:23 PM   Modules accepted: Orders

## 2021-12-04 ENCOUNTER — Encounter: Payer: Self-pay | Admitting: Family

## 2021-12-09 ENCOUNTER — Ambulatory Visit: Payer: BC Managed Care – PPO | Admitting: Family

## 2021-12-17 ENCOUNTER — Ambulatory Visit: Payer: BC Managed Care – PPO | Admitting: Family

## 2022-04-04 ENCOUNTER — Ambulatory Visit (HOSPITAL_BASED_OUTPATIENT_CLINIC_OR_DEPARTMENT_OTHER)
Admission: RE | Admit: 2022-04-04 | Discharge: 2022-04-04 | Disposition: A | Discharge status: Home / Self Care | Payer: BC Managed Care – PPO | Source: Ambulatory Visit | Attending: Family | Admitting: Family

## 2022-04-04 ENCOUNTER — Ambulatory Visit: Payer: BC Managed Care – PPO | Admitting: Family

## 2022-04-04 ENCOUNTER — Encounter: Payer: Self-pay | Admitting: Family

## 2022-04-04 ENCOUNTER — Telehealth: Payer: Self-pay | Admitting: Family

## 2022-04-04 VITALS — BP 118/86 | HR 84 | Temp 98.2°F | Resp 16 | Wt 143.0 lb

## 2022-04-04 DIAGNOSIS — R1013 Epigastric pain: Secondary | ICD-10-CM | POA: Insufficient documentation

## 2022-04-04 DIAGNOSIS — R599 Enlarged lymph nodes, unspecified: Secondary | ICD-10-CM | POA: Diagnosis not present

## 2022-04-04 LAB — POCT URINE PREGNANCY: Preg Test, Ur: NEGATIVE

## 2022-04-04 MED ORDER — SUCRALFATE 1 G PO TABS: 1.0000 g | ORAL_TABLET | Freq: Three times a day (TID) | ORAL | 1 refills | Status: DC

## 2022-04-04 MED ORDER — PANTOPRAZOLE SODIUM 40 MG PO TBEC: 40.0000 mg | DELAYED_RELEASE_TABLET | Freq: Every day | ORAL | 3 refills | Status: DC

## 2022-04-04 NOTE — Assessment & Plan Note (Signed)
Reassurance provided. Advised pt to call if LN enlarges or if redness/pain occurs.

## 2022-04-04 NOTE — Assessment & Plan Note (Signed)
?   Gastric ulcers,  ? Pancreatitis.  Will begin empiric pantoprozole and carafate. Refer to GI, labs as ordered. KUB is unremarkable.

## 2022-04-04 NOTE — Progress Notes (Signed)
Subjective:   By signing my name below, I, Madelin Rear, attest that this documentation has been prepared under the direction and in the presence of Debbrah Alar, NP. 04/04/2022.   Patient ID: Emily Robbins, female    DOB: Jun 20, 1980, 42 y.o.   MRN: [id number]  Chief Complaint  Patient presents with   Abdominal Pain    Patient complains of abdominal pain for about 1 month.    Cyst    Patient reports small knot behind right ear    HPI Patient is in today for an office visit.  Abdominal pain:  She complains of aching abdominal pains occurring every day. Her symptoms resolve temporarily after she eats. She is also taking Nexium and omeprazole which do help. Previously she was on pantoprazole which was effective. She notes that this pain often flares up with stress. Additionally she complains of abdominal bloating/swelling, but notes she has only gained about 2 lbs. Wt Readings from Last 3 Encounters:  04/04/22 143 lb (64.9 kg)  11/12/21 142 lb (64.4 kg)  06/12/21 139 lb (63 kg)   External ear pain:  She has been aware of discomfort localized to areas just inferior and dorsal to her ears. This has been ongoing for a while but is recently becoming more painful. Of note, she states that she is also recovering from a recent cold.    Past Medical History:  Diagnosis Date   GERD (gastroesophageal reflux disease)    H/O gastroesophageal reflux (GERD)    Herpes simplex type 2 infection    History of stomach ulcers    History of syphilis    Iron deficiency anemia 11/01/2018    Past Surgical History:  Procedure Laterality Date   ESOPHAGOGASTRODUODENOSCOPY     to evaluate for ulcers   MYOMECTOMY N/A 05/22/2020   Procedure: ABDOMINAL MYOMECTOMY;  Surgeon: Lavonia Drafts, MD;  Location: [facility];  Service: Gynecology;  Laterality: N/A;   OTHER SURGICAL HISTORY  2015   boil removed from buttock    Family History  Problem Relation Age of Onset   Hypertension Mother     Other Mother        borderline diabetes   Hypertension Father    Asthma Father    Hyperlipidemia Father    Hypertension Brother    Hypertension Maternal Grandmother    Diabetes Maternal Grandmother    Seizures Maternal Grandmother    Hypertension Brother     Social History   Socioeconomic History   Marital status: Single    Spouse name: Not on file   Number of children: Not on file   Years of education: Not on file   Highest education level: Not on file  Occupational History   Not on file  Tobacco Use   Smoking status: Every Day    Packs/day: .5    Types: Cigarettes    Start date: 01/21/2000   Smokeless tobacco: Never  Vaping Use   Vaping Use: Former  Substance and Sexual Activity   Alcohol use: Not on file    Comment: occasional   Drug use: Never   Sexual activity: Yes    Partners: Female  Other Topics Concern   Not on file  Social History Narrative   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   Was raised by her grandmother   Lives with female partner   Social Determinants of Health   Financial Resource Strain: Not on file  Food Insecurity: Not on file  Transportation Needs: Not on file  Physical Activity: Not on file  Stress: Not on file  Social Connections: Not on file  Intimate Partner Violence: Not on file    Outpatient Medications Prior to Visit  Medication Sig Dispense Refill   albuterol (VENTOLIN HFA) 108 (90 Base) MCG/ACT inhaler TAKE 2 PUFFS BY MOUTH EVERY 6 HOURS AS NEEDED FOR WHEEZE OR SHORTNESS OF BREATH 8.5 each 1   cetirizine (ZYRTEC) 10 MG tablet Take 1 tablet (10 mg total) by mouth daily. 90 tablet 1   clotrimazole (LOTRIMIN) 1 % cream Apply 1 application topically daily as needed (athlete's foot).     Fe Fum-FePoly-FA-Vit C-Vit B3 (INTEGRA F) 125-1 MG CAPS Take 1 capsule by mouth daily. 30 capsule 6   ibuprofen (ADVIL) 600 MG tablet Take 1 tablet (600 mg total) by mouth every 6 (six)  hours. 30 tablet 3   valACYclovir (VALTREX) 500 MG tablet Take 1 tablet (500 mg total) by mouth daily. 90 tablet 1   Varenicline Tartrate, Starter, (CHANTIX STARTING MONTH PAK) 0.5 MG X 11 & 1 MG X 42 TBPK Use per package instructions. 8 each 0   No facility-administered medications prior to visit.    No Known Allergies  Review of Systems  HENT:         Pain inferior and dorsal to her bilateral ears.  Gastrointestinal:  Positive for abdominal pain.       Objective:    Physical Exam Constitutional:      Appearance: Normal appearance.  HENT:     Head: Normocephalic and atraumatic.     Right Ear: Tympanic membrane, ear canal and external ear normal.     Left Ear: Tympanic membrane, ear canal and external ear normal.  Eyes:     Extraocular Movements: Extraocular movements intact.     Pupils: Pupils are equal, round, and reactive to light.  Cardiovascular:     Rate and Rhythm: Normal rate and regular rhythm.     Heart sounds: Normal heart sounds. No murmur heard.    No gallop.  Pulmonary:     Effort: Pulmonary effort is normal. No respiratory distress.     Breath sounds: Normal breath sounds. No wheezing or rales.  Abdominal:     General: Abdomen is flat. Bowel sounds are normal.     Palpations: Abdomen is soft.     Tenderness: There is abdominal tenderness in the epigastric area and left upper quadrant.  Lymphadenopathy:     Comments: Approximately 0.5 cm wide lymph node noted behind right ear, mobile and nontender.  Skin:    General: Skin is warm and dry.  Neurological:     General: No focal deficit present.     Mental Status: She is alert and oriented to person, place, and time.  Psychiatric:        Mood and Affect: Mood normal.        Behavior: Behavior normal.     BP 118/86 (BP Location: Right Arm, Patient Position: Sitting, Cuff Size: Small)   Pulse 84   Temp 98.2 F (36.8 C) (Oral)   Resp 16   Wt 143 lb (64.9 kg)   LMP 03/24/2022   SpO2 100%   BMI 26.16  kg/m  Wt Readings from Last 3 Encounters:  04/04/22 143 lb (64.9 kg)  11/12/21 142 lb (64.4 kg)  06/12/21 139 lb (63 kg)       Assessment & Plan:   Problem List Items Addressed  This Visit       Unprioritized   Palpable lymph node    Reassurance provided. Advised pt to call if LN enlarges or if redness/pain occurs.      Epigastric pain - Primary    ? Gastric ulcers,  ? Pancreatitis.  Will begin empiric pantoprozole and carafate. Refer to GI, labs as ordered. KUB is unremarkable.       Relevant Medications   pantoprazole (PROTONIX) 40 MG tablet   sucralfate (CARAFATE) 1 g tablet   Other Relevant Orders   Ambulatory referral to Gastroenterology   Lipase   DG Abd 2 Views (Completed)   POCT urine pregnancy (Completed)    See phone note re: CBC, CMET  Meds ordered this encounter  Medications   pantoprazole (PROTONIX) 40 MG tablet    Sig: Take 1 tablet (40 mg total) by mouth daily.    Dispense:  30 tablet    Refill:  3    Order Specific Question:   Supervising Provider    Answer:   Penni Homans A [[id number]]   sucralfate (CARAFATE) 1 g tablet    Sig: Take 1 tablet (1 g total) by mouth 4 (four) times daily -  with meals and at bedtime.    Dispense:  120 tablet    Refill:  1    Order Specific Question:   Supervising Provider    Answer:   Penni Homans A [[id number]]    I, Nance Pear, NP, personally preformed the services described in this documentation.  All medical record entries made by the scribe were at my direction and in my presence.  I have reviewed the chart and discharge instructions (if applicable) and agree that the record reflects my personal performance and is accurate and complete. 04/04/2022.  I,Mathew Stumpf,acting as a Education administrator for [employer], NP.,have documented all relevant documentation on the behalf of Nance Pear, NP,as directed by  Nance Pear, NP while in the presence of Nance Pear, NP.   Nance Pear,  NP

## 2022-04-04 NOTE — Telephone Encounter (Signed)
Please advise pt that I thought I had included some addition labs in her blood draw but discovered I did not. Would she be available to return to the lab early next week?  My apologies. Labs ordered.

## 2022-04-05 LAB — LIPASE: Lipase: 49 U/L (ref 7–60)

## 2022-04-07 NOTE — Telephone Encounter (Signed)
Patient received mychart message from provider and aware of results and need for additional labs. Was scheduled to come in tomorrow afternoon for this.

## 2022-04-08 ENCOUNTER — Other Ambulatory Visit: Payer: BC Managed Care – PPO

## 2022-04-09 ENCOUNTER — Other Ambulatory Visit (INDEPENDENT_AMBULATORY_CARE_PROVIDER_SITE_OTHER): Payer: BC Managed Care – PPO

## 2022-04-09 DIAGNOSIS — R1013 Epigastric pain: Secondary | ICD-10-CM | POA: Diagnosis not present

## 2022-04-10 LAB — CBC WITH DIFFERENTIAL/PLATELET
Basophils Absolute: 0.1 10*3/uL (ref 0.0–0.1)
Basophils Relative: 0.7 % (ref 0.0–3.0)
Eosinophils Absolute: 0.4 10*3/uL (ref 0.0–0.7)
Eosinophils Relative: 4.3 % (ref 0.0–5.0)
HCT: 38.8 % (ref 36.0–46.0)
Hemoglobin: 12.5 g/dL (ref 12.0–15.0)
Lymphocytes Relative: 40.3 % (ref 12.0–46.0)
Lymphs Abs: 3.3 10*3/uL (ref 0.7–4.0)
MCHC: 32.1 g/dL (ref 30.0–36.0)
MCV: 84.6 fl (ref 78.0–100.0)
Monocytes Absolute: 0.6 10*3/uL (ref 0.1–1.0)
Monocytes Relative: 7.5 % (ref 3.0–12.0)
Neutro Abs: 3.9 10*3/uL (ref 1.4–7.7)
Neutrophils Relative %: 47.2 % (ref 43.0–77.0)
Platelets: 356 10*3/uL (ref 150.0–400.0)
RBC: 4.58 Mil/uL (ref 3.87–5.11)
RDW: 14.3 % (ref 11.5–15.5)
WBC: 8.2 10*3/uL (ref 4.0–10.5)

## 2022-04-10 LAB — COMPREHENSIVE METABOLIC PANEL
ALT: 9 U/L (ref 0–35)
AST: 13 U/L (ref 0–37)
Albumin: 4.3 g/dL (ref 3.5–5.2)
Alkaline Phosphatase: 62 U/L (ref 39–117)
BUN: 15 mg/dL (ref 6–23)
CO2: 24 mEq/L (ref 19–32)
Calcium: 9.7 mg/dL (ref 8.4–10.5)
Chloride: 105 mEq/L (ref 96–112)
Creatinine, Ser: 0.95 mg/dL (ref 0.40–1.20)
GFR: 75.42 mL/min (ref >60.00)
Glucose, Bld: 116 mg/dL — ABNORMAL HIGH (ref 70–99)
Potassium: 3.7 mEq/L (ref 3.5–5.1)
Sodium: 136 mEq/L (ref 135–145)
Total Bilirubin: 0.3 mg/dL (ref 0.2–1.2)
Total Protein: 7 g/dL (ref 6.0–8.3)

## 2022-04-11 ENCOUNTER — Other Ambulatory Visit: Payer: Self-pay | Admitting: *Deleted

## 2022-04-11 ENCOUNTER — Other Ambulatory Visit: Payer: BC Managed Care – PPO

## 2022-04-11 DIAGNOSIS — R739 Hyperglycemia, unspecified: Secondary | ICD-10-CM

## 2022-04-12 LAB — HEMOGLOBIN A1C
Hgb A1c MFr Bld: 5.4 % of total Hgb (ref <5.7)
Mean Plasma Glucose: 108 mg/dL
eAG (mmol/L): 6 mmol/L

## 2022-04-14 ENCOUNTER — Telehealth: Payer: Self-pay | Admitting: Gastroenterology

## 2022-04-14 NOTE — Telephone Encounter (Signed)
Good morning Dr. Silverio Decamp,   Supervising Provider AM 04/14/22   We received a referral for this patient for epigastric pain. Patient last had an EGD in 2016 with Dr. Gretta Began at [facility] in [place]. Patient since then has moved to [facility] and would like to be seen with a GI provider locally. Records are in [place] for your review. Please advise on scheduling.   Thank you.

## 2022-04-15 NOTE — Telephone Encounter (Signed)
Please schedule next available appointment with APP. Thanks 

## 2022-04-16 ENCOUNTER — Encounter: Payer: Self-pay | Admitting: Gastroenterology

## 2022-05-26 ENCOUNTER — Encounter: Payer: Self-pay | Admitting: Family

## 2022-05-26 ENCOUNTER — Other Ambulatory Visit: Payer: Self-pay | Admitting: Family

## 2022-05-27 MED ORDER — FLUCONAZOLE 150 MG PO TABS: ORAL_TABLET | ORAL | 0 refills | Status: DC

## 2022-05-27 MED ORDER — CLOTRIMAZOLE 1 % EX CREA: 1.0000 | TOPICAL_CREAM | Freq: Every day | CUTANEOUS | 1 refills | Status: DC | PRN

## 2022-05-30 ENCOUNTER — Ambulatory Visit: Payer: BC Managed Care – PPO | Admitting: Gastroenterology

## 2022-08-05 ENCOUNTER — Ambulatory Visit: Payer: BC Managed Care – PPO | Admitting: Gastroenterology

## 2022-08-05 ENCOUNTER — Encounter: Payer: Self-pay | Admitting: Gastroenterology

## 2022-08-05 VITALS — BP 110/60 | HR 92 | Ht 63.0 in | Wt 143.2 lb

## 2022-08-05 DIAGNOSIS — R1013 Epigastric pain: Secondary | ICD-10-CM | POA: Diagnosis not present

## 2022-08-05 DIAGNOSIS — K219 Gastro-esophageal reflux disease without esophagitis: Secondary | ICD-10-CM

## 2022-08-05 NOTE — Progress Notes (Signed)
08/05/2022 Emily Robbins [id number] November 27, 1980   HISTORY OF PRESENT ILLNESS: This is a 42 year old female who is new to our office.  She has been referred here by her PCP, Sandford Craze, NP, for evaluation regarding epigastric abdominal pain.  She tells me that she has been having pain in her epigastrium for the past 10 months or so.  Also having heartburn and reflux.  She tells me that she has not been on anything for reflux for quite some time and just picked up her new Protonix prescription yesterday.  She drinks rare alcohol.  She does smoke.  She does not take any NSAIDs.  EGD 05/2013 at [facility]:  Findings: Esophagus: The esophagus appeared to be normal. Stomach: There was erosion in the antrum. Duodenum: The duodenum appeared to be normal.   I do not see any pathology.  Of note, they said that she had a lot of coughing related to the procedure.  Past Medical History:  Diagnosis Date   GERD (gastroesophageal reflux disease)    H/O gastroesophageal reflux (GERD)    Herpes simplex type 2 infection    History of stomach ulcers    History of syphilis    Iron deficiency anemia 11/01/2018   Past Surgical History:  Procedure Laterality Date   ESOPHAGOGASTRODUODENOSCOPY     to evaluate for ulcers   MYOMECTOMY N/A 05/22/2020   Procedure: ABDOMINAL MYOMECTOMY;  Surgeon: Willodean Rosenthal, MD;  Location: [facility];  Service: Gynecology;  Laterality: N/A;   OTHER SURGICAL HISTORY  2015   boil removed from buttock    reports that she has been smoking cigarettes. She started smoking about 22 years ago. She has a 11.3 pack-year smoking history. She has never used smokeless tobacco. She reports current alcohol use of about 1.0 standard drink of alcohol per week. She reports that she does not use drugs. family history includes Asthma in her father; Colon cancer in her paternal aunt and paternal grandfather; Diabetes in her father, maternal grandmother, and mother; Hyperlipidemia  in her father; Hypertension in her brother, brother, father, maternal grandmother, and mother; Seizures in her maternal grandmother. No Known Allergies    Outpatient Encounter Medications as of 08/05/2022  Medication Sig   cetirizine (ZYRTEC) 10 MG tablet Take 1 tablet (10 mg total) by mouth daily.   clotrimazole (LOTRIMIN) 1 % cream Apply 1 Application topically daily as needed (athlete's foot).   Fe Fum-FePoly-FA-Vit C-Vit B3 (INTEGRA F) 125-1 MG CAPS Take 1 capsule by mouth daily.   pantoprazole (PROTONIX) 40 MG tablet Take 1 tablet (40 mg total) by mouth daily.   valACYclovir (VALTREX) 500 MG tablet Take 1 tablet (500 mg total) by mouth daily.   [DISCONTINUED] albuterol (VENTOLIN HFA) 108 (90 Base) MCG/ACT inhaler TAKE 2 PUFFS BY MOUTH EVERY 6 HOURS AS NEEDED FOR WHEEZE OR SHORTNESS OF BREATH   [DISCONTINUED] fluconazole (DIFLUCAN) 150 MG tablet Take 1 tablet by mouth today. May repeat in 3 days if symptoms are not resolved.   [DISCONTINUED] ibuprofen (ADVIL) 600 MG tablet Take 1 tablet (600 mg total) by mouth every 6 (six) hours.   [DISCONTINUED] sucralfate (CARAFATE) 1 g tablet Take 1 tablet (1 g total) by mouth 4 (four) times daily -  with meals and at bedtime.   [DISCONTINUED] Varenicline Tartrate, Starter, (CHANTIX STARTING MONTH PAK) 0.5 MG X 11 & 1 MG X 42 TBPK Use per package instructions.   No facility-administered encounter medications on file as of 08/05/2022.    REVIEW OF  SYSTEMS  : All other systems reviewed and negative except where noted in the History of Present Illness.   PHYSICAL EXAM: BP 110/60 (BP Location: Left Arm, Patient Position: Sitting, Cuff Size: Normal)   Pulse 92   Ht 5\' 3"  (1.6 m) Comment: height measured without shoes  Wt 143 lb 4 oz (65 kg)   BMI 25.38 kg/m  General: Well developed female in no acute distress Head: Normocephalic and atraumatic Eyes:  Sclerae anicteric, conjunctiva pink. Ears: Normal auditory acuity Lungs: Clear throughout to  auscultation; no W/R/R. Heart: Regular rate and rhythm; no M/R/G. Abdomen: Soft, non-distended.  BS present.  Mild epigastric TTP. Musculoskeletal: Symmetrical with no gross deformities  Skin: No lesions on visible extremities Extremities: No edema  Neurological: Alert oriented x 4, grossly non-focal Psychological:  Alert and cooperative. Normal mood and affect  ASSESSMENT AND PLAN: *Epigastric abdominal pain: Possibly acid related versus more of an IBS/dyspepsia type of situation.  She had an EGD in 2015 that showed only gastric erosions.  She has not been on any type of PPI therapy regularly until just starting her prescription again yesterday.  Of note, she had a lot of coughing during her endoscopy in 2015 according to their note.  I am going to have her take her pantoprazole for the next couple of months and see how she does.  Also discussed GERD diet/lifestyles changes and she was given literature on this.  She does admit that she is under a lot of stress.  If no improvement with taking it regularly and following some GERD dietary measures then can consider repeat endoscopy versus upper GI series.   CC:  Sandford Craze, NP

## 2022-08-05 NOTE — Patient Instructions (Signed)
Continue Pantoprazole.   Reflux handouts provided.  _______________________________________________________  If your blood pressure at your visit was 140/90 or greater, please contact your primary care physician to follow up on this.  _______________________________________________________  If you are age 40 or older, your body mass index should be between 23-30. Your Body mass index is 25.38 kg/m. If this is out of the aforementioned range listed, please consider follow up with your Primary Care Provider.  If you are age 46 or younger, your body mass index should be between 19-25. Your Body mass index is 25.38 kg/m. If this is out of the aformentioned range listed, please consider follow up with your Primary Care Provider.   ________________________________________________________  The [facility] providers would like to encourage you to use [facility] to communicate with providers for non-urgent requests or questions.  Due to long hold times on the telephone, sending your provider a message by [facility] may be a faster and more efficient way to get a response.  Please allow 48 business hours for a response.  Please remember that this is for non-urgent requests.  _______________________________________________________

## 2022-08-05 NOTE — Progress Notes (Signed)
____________________________________________________________  Attending physician addendum:  Thank you for sending this case to me. I have reviewed the entire note and agree with the plan.  In addition, if she has not previously had a right upper quadrant ultrasound to evaluate gallstones, then I feel that should be done in this patient with chronic epigastric pain.  Amada Jupiter, MD  ____________________________________________________________

## 2022-08-06 ENCOUNTER — Telehealth: Payer: Self-pay

## 2022-08-06 DIAGNOSIS — R1013 Epigastric pain: Secondary | ICD-10-CM

## 2022-08-06 NOTE — Telephone Encounter (Signed)
-----   Message from [place] D. Zehr sent at 08/05/2022  4:40 PM EDT ----- I saw her in clinic today.  If she is willing then lets also schedule a right upper quadrant abdominal ultrasound to evaluate her gallbladder for now as well while we await the results with the protonix.  Thank you,  Jess ----- Message ----- From: Sherrilyn Rist, MD Sent: 08/05/2022   4:36 PM EDT To: Leta Baptist, PA-C     ----- Message ----- From: Leta Baptist, PA-C Sent: 08/05/2022   4:19 PM EDT To: Sherrilyn Rist, MD

## 2022-08-06 NOTE — Telephone Encounter (Signed)
Left message on machine to call back  

## 2022-08-06 NOTE — Telephone Encounter (Signed)
The pt has been advised and is agreeable to the [place]. Order entered and sent to the schedulers

## 2022-08-06 NOTE — Telephone Encounter (Signed)
 Patient is returning your call.  

## 2022-08-13 ENCOUNTER — Ambulatory Visit (HOSPITAL_COMMUNITY): Admission: RE | Admit: 2022-08-13 | Payer: BC Managed Care – PPO | Source: Ambulatory Visit

## 2022-09-10 ENCOUNTER — Encounter: Payer: Self-pay | Admitting: Family

## 2022-09-10 DIAGNOSIS — D649 Anemia, unspecified: Secondary | ICD-10-CM

## 2022-09-10 DIAGNOSIS — R739 Hyperglycemia, unspecified: Secondary | ICD-10-CM

## 2022-09-16 ENCOUNTER — Other Ambulatory Visit (INDEPENDENT_AMBULATORY_CARE_PROVIDER_SITE_OTHER): Payer: BC Managed Care – PPO

## 2022-09-16 DIAGNOSIS — D649 Anemia, unspecified: Secondary | ICD-10-CM | POA: Diagnosis not present

## 2022-09-16 DIAGNOSIS — R739 Hyperglycemia, unspecified: Secondary | ICD-10-CM | POA: Diagnosis not present

## 2022-09-17 LAB — CBC WITH DIFFERENTIAL/PLATELET
Basophils Absolute: 0 10*3/uL (ref 0.0–0.1)
Basophils Relative: 0.3 % (ref 0.0–3.0)
Eosinophils Absolute: 0 10*3/uL (ref 0.0–0.7)
Eosinophils Relative: 0.2 % (ref 0.0–5.0)
HCT: 37.1 % (ref 36.0–46.0)
Hemoglobin: 11.7 g/dL — ABNORMAL LOW (ref 12.0–15.0)
Lymphocytes Relative: 36.1 % (ref 12.0–46.0)
Lymphs Abs: 2.7 10*3/uL (ref 0.7–4.0)
MCHC: 31.7 g/dL (ref 30.0–36.0)
MCV: 84.1 fl (ref 78.0–100.0)
Monocytes Absolute: 0.5 10*3/uL (ref 0.1–1.0)
Monocytes Relative: 6.7 % (ref 3.0–12.0)
Neutro Abs: 4.3 10*3/uL (ref 1.4–7.7)
Neutrophils Relative %: 56.7 % (ref 43.0–77.0)
Platelets: 337 10*3/uL (ref 150.0–400.0)
RBC: 4.41 Mil/uL (ref 3.87–5.11)
RDW: 14.3 % (ref 11.5–15.5)
WBC: 7.6 10*3/uL (ref 4.0–10.5)

## 2022-09-17 LAB — BASIC METABOLIC PANEL
BUN: 11 mg/dL (ref 6–23)
CO2: 26 meq/L (ref 19–32)
Calcium: 9.4 mg/dL (ref 8.4–10.5)
Chloride: 108 meq/L (ref 96–112)
Creatinine, Ser: 0.83 mg/dL (ref 0.40–1.20)
GFR: 88.42 mL/min (ref >60.00)
Glucose, Bld: 113 mg/dL — ABNORMAL HIGH (ref 70–99)
Potassium: 3.8 meq/L (ref 3.5–5.1)
Sodium: 140 meq/L (ref 135–145)

## 2022-09-17 LAB — IRON,TIBC AND FERRITIN PANEL
%SAT: 21 % (ref 16–45)
Ferritin: 11 ng/mL — ABNORMAL LOW (ref 16–154)
Iron: 70 ug/dL (ref 40–190)
TIBC: 340 ug/dL (ref 250–450)

## 2022-10-21 ENCOUNTER — Ambulatory Visit: Payer: BC Managed Care – PPO | Admitting: Gastroenterology

## 2022-11-10 ENCOUNTER — Telehealth: Payer: BC Managed Care – PPO | Admitting: Physician Assistant

## 2022-11-10 DIAGNOSIS — J069 Acute upper respiratory infection, unspecified: Secondary | ICD-10-CM

## 2022-11-10 MED ORDER — PSEUDOEPH-BROMPHEN-DM 30-2-10 MG/5ML PO SYRP
5.0000 mL | ORAL_SOLUTION | Freq: Four times a day (QID) | ORAL | 0 refills | Status: AC | PRN
Start: 2022-11-10 — End: ?

## 2022-11-10 MED ORDER — FLUTICASONE PROPIONATE 50 MCG/ACT NA SUSP: 2.0000 | Freq: Every day | NASAL | 0 refills | Status: DC

## 2022-11-10 MED ORDER — ALBUTEROL SULFATE HFA 108 (90 BASE) MCG/ACT IN AERS
1.0000 | INHALATION_SPRAY | Freq: Four times a day (QID) | RESPIRATORY_TRACT | 0 refills | Status: AC | PRN
Start: 2022-11-10 — End: ?

## 2022-11-10 NOTE — Progress Notes (Signed)
Virtual Visit Consent   Emily Robbins, you are scheduled for a virtual visit with a [facility] provider today. Just as with appointments in the office, your consent must be obtained to participate. Your consent will be active for this visit and any virtual visit you may have with one of our providers in the next 365 days. If you have a MyChart account, a copy of this consent can be sent to you electronically.  As this is a virtual visit, video technology does not allow for your provider to perform a traditional examination. This may limit your provider's ability to fully assess your condition. If your provider identifies any concerns that need to be evaluated in person or the need to arrange testing (such as labs, EKG, etc.), we will make arrangements to do so. Although advances in technology are sophisticated, we cannot ensure that it will always work on either your end or our end. If the connection with a video visit is poor, the visit may have to be switched to a telephone visit. With either a video or telephone visit, we are not always able to ensure that we have a secure connection.  By engaging in this virtual visit, you consent to the provision of healthcare and authorize for your insurance to be billed (if applicable) for the services provided during this visit. Depending on your insurance coverage, you may receive a charge related to this service.  I need to obtain your verbal consent now. Are you willing to proceed with your visit today? Emily Robbins has provided verbal consent on 11/10/2022 for a virtual visit (video or telephone). Emily Loveless, PA-C  Date: 11/10/2022 9:08 AM  Virtual Visit via Video Note   I, Emily Robbins, connected with  Emily Robbins  ([id number], Nov 21, 1980) on 11/10/22 at  9:00 AM EDT by a video-enabled telemedicine application and verified that I am speaking with the correct person using two identifiers.  Location: Patient:  Virtual Visit Location Patient: Home Provider: Virtual Visit Location Provider: Home Office   I discussed the limitations of evaluation and management by telemedicine and the availability of in person appointments. The patient expressed understanding and agreed to proceed.    History of Present Illness: Emily Robbins is a 42 y.o. who identifies as a female who was assigned female at birth, and is being seen today for URI symptoms.  HPI: URI  This is a new problem. The current episode started in the past 7 days (Friday night, 11/07/22). The problem has been gradually improving. Maximum temperature: subjective fevers. Associated symptoms include congestion, coughing, headaches, rhinorrhea, sinus pain, a sore throat and wheezing. Pertinent negatives include no diarrhea, ear pain, nausea, plugged ear sensation or vomiting. Treatments tried: nyquil, dayquil, vicks vapor rub. The treatment provided no relief.  Covid 19 at home testing is negative    Problems:  Patient Active Problem List   Diagnosis Date Noted   Palpable lymph node 04/04/2022   Epigastric pain 04/04/2022   Recurrent vaginitis 11/12/2021   Tobacco abuse 11/12/2021   Dental infection 07/12/2021   History of syphilis 06/14/2021   Herpes simplex type 2 infection 06/14/2021   Postoperative anemia due to acute blood loss 05/23/2020   Fibroids, intramural 05/22/2020   S/P myomectomy 05/22/2020   Iron deficiency anemia 11/01/2018   Allergic rhinitis 07/15/2017   GERD (gastroesophageal reflux disease) 07/15/2017   Stress and adjustment reaction 07/15/2017    Allergies: No Known Allergies Medications:  Current Outpatient Medications:  albuterol (VENTOLIN HFA) 108 (90 Base) MCG/ACT inhaler, Inhale 1-2 puffs into the lungs every 6 (six) hours as needed., Disp: 8 g, Rfl: 0   brompheniramine-pseudoephedrine-DM 30-2-10 MG/5ML syrup, Take 5 mLs by mouth 4 (four) times daily as needed., Disp: 120 mL, Rfl: 0   cetirizine  (ZYRTEC) 10 MG tablet, Take 1 tablet (10 mg total) by mouth daily., Disp: 90 tablet, Rfl: 1   clotrimazole (LOTRIMIN) 1 % cream, Apply 1 Application topically daily as needed (athlete's foot)., Disp: 30 g, Rfl: 1   Fe Fum-FePoly-FA-Vit C-Vit B3 (INTEGRA F) 125-1 MG CAPS, Take 1 capsule by mouth daily., Disp: 30 capsule, Rfl: 6   fluticasone (FLONASE) 50 MCG/ACT nasal spray, Place 2 sprays into both nostrils daily., Disp: 16 g, Rfl: 0   pantoprazole (PROTONIX) 40 MG tablet, Take 1 tablet (40 mg total) by mouth daily., Disp: 30 tablet, Rfl: 3   valACYclovir (VALTREX) 500 MG tablet, Take 1 tablet (500 mg total) by mouth daily., Disp: 90 tablet, Rfl: 1  Observations/Objective: Patient is well-developed, well-nourished in no acute distress.  Resting comfortably at home.  Head is normocephalic, atraumatic.  No labored breathing.  Speech is clear and coherent with logical content.  Patient is alert and oriented at baseline.    Assessment and Plan: 1. Viral URI with cough - albuterol (VENTOLIN HFA) 108 (90 Base) MCG/ACT inhaler; Inhale 1-2 puffs into the lungs every 6 (six) hours as needed.  Dispense: 8 g; Refill: 0 - brompheniramine-pseudoephedrine-DM 30-2-10 MG/5ML syrup; Take 5 mLs by mouth 4 (four) times daily as needed.  Dispense: 120 mL; Refill: 0 - fluticasone (FLONASE) 50 MCG/ACT nasal spray; Place 2 sprays into both nostrils daily.  Dispense: 16 g; Refill: 0  - Suspect viral URI - Prescribed Bromfed DM for cough, Flonase for congestion, and Albuterol for chest tightness and wheezing - Symptomatic medications of choice over the counter as needed - Push fluids - Rest - Seek further evaluation if symptoms change or worsen   Follow Up Instructions: I discussed the assessment and treatment plan with the patient. The patient was provided an opportunity to ask questions and all were answered. The patient agreed with the plan and demonstrated an understanding of the instructions.  A copy of  instructions were sent to the patient via MyChart unless otherwise noted below.    The patient was advised to call back or seek an in-person evaluation if the symptoms worsen or if the condition fails to improve as anticipated.    Emily Loveless, PA-C

## 2022-11-10 NOTE — Patient Instructions (Signed)
Emily Reasons Delawder, thank you for joining Margaretann Loveless, PA-C for today's virtual visit.  While this provider is not your primary care provider (PCP), if your PCP is located in our provider database this encounter information will be shared with them immediately following your visit.   A [facility] MyChart account gives you access to today's visit and all your visits, tests, and labs performed at [facility] " click here if you don't have a [facility] MyChart account or go to [email or web address]  Consent: (Patient) Emily Robbins provided verbal consent for this virtual visit at the beginning of the encounter.  Current Medications:  Current Outpatient Medications:    albuterol (VENTOLIN HFA) 108 (90 Base) MCG/ACT inhaler, Inhale 1-2 puffs into the lungs every 6 (six) hours as needed., Disp: 8 g, Rfl: 0   brompheniramine-pseudoephedrine-DM 30-2-10 MG/5ML syrup, Take 5 mLs by mouth 4 (four) times daily as needed., Disp: 120 mL, Rfl: 0   cetirizine (ZYRTEC) 10 MG tablet, Take 1 tablet (10 mg total) by mouth daily., Disp: 90 tablet, Rfl: 1   clotrimazole (LOTRIMIN) 1 % cream, Apply 1 Application topically daily as needed (athlete's foot)., Disp: 30 g, Rfl: 1   Fe Fum-FePoly-FA-Vit C-Vit B3 (INTEGRA F) 125-1 MG CAPS, Take 1 capsule by mouth daily., Disp: 30 capsule, Rfl: 6   fluticasone (FLONASE) 50 MCG/ACT nasal spray, Place 2 sprays into both nostrils daily., Disp: 16 g, Rfl: 0   pantoprazole (PROTONIX) 40 MG tablet, Take 1 tablet (40 mg total) by mouth daily., Disp: 30 tablet, Rfl: 3   valACYclovir (VALTREX) 500 MG tablet, Take 1 tablet (500 mg total) by mouth daily., Disp: 90 tablet, Rfl: 1   Medications ordered in this encounter:  Meds ordered this encounter  Medications   albuterol (VENTOLIN HFA) 108 (90 Base) MCG/ACT inhaler    Sig: Inhale 1-2 puffs into the lungs every 6 (six) hours as needed.    Dispense:  8 g    Refill:  0    Order Specific  Question:   Supervising Provider    Answer:   Merrilee Jansky [id number]   brompheniramine-pseudoephedrine-DM 30-2-10 MG/5ML syrup    Sig: Take 5 mLs by mouth 4 (four) times daily as needed.    Dispense:  120 mL    Refill:  0    Order Specific Question:   Supervising Provider    Answer:   Merrilee Jansky [[id number]]   fluticasone (FLONASE) 50 MCG/ACT nasal spray    Sig: Place 2 sprays into both nostrils daily.    Dispense:  16 g    Refill:  0    Order Specific Question:   Supervising Provider    Answer:   Merrilee Jansky [id number]     *If you need refills on other medications prior to your next appointment, please contact your pharmacy*  Follow-Up: Call back or seek an in-person evaluation if the symptoms worsen or if the condition fails to improve as anticipated.  [facility] [phone]  Other Instructions Viral Respiratory Infection A respiratory infection is an illness that affects part of the respiratory system, such as the lungs, nose, or throat. A respiratory infection that is caused by a virus is called a viral respiratory infection. Common types of viral respiratory infections include: A cold. The flu (influenza). A respiratory syncytial virus (RSV) infection. What are the causes? This condition is caused by a virus. The virus may spread through contact with droplets or direct contact with  infected people or their mucus or secretions. The virus may spread from person to person (is contagious). What are the signs or symptoms? Symptoms of this condition include: A stuffy or runny nose. A sore throat or cough. Shortness of breath or difficulty breathing. Yellow or green mucus (sputum). Other symptoms may include: A fever. Sweating or chills. Fatigue. Achy muscles. A headache. How is this diagnosed? This condition may be diagnosed based on: Your symptoms. A physical exam. Testing of secretions from the nose or throat. Chest X-ray. How is this  treated? This condition may be treated with medicines, such as: Antiviral medicine. This may shorten the length of time a person has symptoms. Expectorants. These make it easier to cough up mucus. Decongestant nasal sprays. Acetaminophen or NSAIDs, such as ibuprofen, to relieve fever and pain. Antibiotic medicines are not prescribed for viral infections.This is because antibiotics are designed to kill bacteria. They do not kill viruses. Follow these instructions at home: Managing pain and congestion Take over-the-counter and prescription medicines only as told by your health care provider. If you have a sore throat, gargle with a mixture of salt and water 3-4 times a day or as needed. To make salt water, completely dissolve -1 tsp (3-6 g) of salt in 1 cup (237 mL) of warm water. Use nose drops made from salt water to ease congestion and soften raw skin around your nose. Take 2 tsp (10 mL) of honey at bedtime to lessen coughing at night. Do not give honey to children who are younger than 1 year. Drink enough fluid to keep your urine pale yellow. This helps prevent dehydration and helps loosen up mucus. General instructions  Rest as much as possible. Do not drink alcohol. Do not use any products that contain nicotine or tobacco. These products include cigarettes, chewing tobacco, and vaping devices, such as e-cigarettes. If you need help quitting, ask your health care provider. Keep all follow-up visits. This is important. How is this prevented?     Get an annual flu shot. You may get the flu shot in late summer, fall, or winter. Ask your health care provider when you should get your flu shot. Avoid spreading your infection to other people. If you are sick: Wash your hands with soap and water often, especially after you cough or sneeze. Wash for at least 20 seconds. If soap and water are not available, use alcohol-based hand sanitizer. Cover your mouth when you cough. Cover your nose and  mouth when you sneeze. Do not share cups or eating utensils. Clean commonly used objects often. Clean commonly touched surfaces. Stay home from work or school as told by your health care provider. Avoid contact with people who are sick during cold and flu season. This is generally fall and winter. Contact a health care provider if: Your symptoms last for 10 days or longer. Your symptoms get worse over time. You have severe sinus pain in your face or forehead. The glands in your jaw or neck become very swollen. You have shortness of breath. Get help right away if you: Feel pain or pressure in your chest. Have trouble breathing. Faint or feel like you will faint. Have severe and persistent vomiting. Feel confused or disoriented. These symptoms may represent a serious problem that is an emergency. Do not wait to see if the symptoms will go away. Get medical help right away. Call your local emergency services (911 in the [place] Do not drive yourself to the hospital. Summary A respiratory infection  is an illness that affects part of the respiratory system, such as the lungs, nose, or throat. A respiratory infection that is caused by a virus is called a viral respiratory infection. Common types of viral respiratory infections include a cold, influenza, and respiratory syncytial virus (RSV) infection. Symptoms of this condition include a stuffy or runny nose, cough, fatigue, achy muscles, sore throat, and fevers or chills. Antibiotic medicines are not prescribed for viral infections. This is because antibiotics are designed to kill bacteria. They are not effective against viruses. This information is not intended to replace advice given to you by your health care provider. Make sure you discuss any questions you have with your health care provider. Document Revised: 04/12/2020 Document Reviewed: 04/12/2020 Elsevier Patient Education  2024 Elsevier Inc.    If you have been instructed to have an  in-person evaluation today at a local Urgent Care facility, please use the link below. It will take you to a list of all of our available [facility], including address, phone number and hours of operation. Please do not delay care.  [facility]  If you or a family member do not have a primary care provider, use the link below to schedule a visit and establish care. When you choose a [facility] primary care physician or advanced practice provider, you gain a long-term partner in health. Find a Primary Care Provider  Learn more about [facility]'s in-office and virtual care options: [facility] Get Care Now

## 2022-11-11 ENCOUNTER — Encounter: Payer: Self-pay | Admitting: Physician Assistant

## 2022-11-11 ENCOUNTER — Ambulatory Visit: Payer: BC Managed Care – PPO | Admitting: Family

## 2022-11-11 ENCOUNTER — Ambulatory Visit (HOSPITAL_BASED_OUTPATIENT_CLINIC_OR_DEPARTMENT_OTHER)
Admission: RE | Admit: 2022-11-11 | Discharge: 2022-11-11 | Disposition: A | Discharge status: Home / Self Care | Payer: BC Managed Care – PPO | Source: Ambulatory Visit | Attending: Family | Admitting: Family

## 2022-11-11 VITALS — BP 130/88 | HR 92 | Temp 98.5°F | Resp 18 | Wt 144.0 lb

## 2022-11-11 DIAGNOSIS — J069 Acute upper respiratory infection, unspecified: Secondary | ICD-10-CM | POA: Insufficient documentation

## 2022-11-11 DIAGNOSIS — R918 Other nonspecific abnormal finding of lung field: Secondary | ICD-10-CM | POA: Diagnosis not present

## 2022-11-11 LAB — POCT INFLUENZA A/B
Influenza A, POC: NEGATIVE
Influenza B, POC: NEGATIVE

## 2022-11-11 NOTE — Progress Notes (Signed)
Subjective:     Patient ID: Emily Robbins, female    DOB: 1980/05/13, 42 y.o.   MRN: [id number]  Chief Complaint  Patient presents with   Cough    Complains of cough   Generalized Body Aches    Cough    Discussed the use of AI scribe software for clinical note transcription with the patient, who gave verbal consent to proceed.  History of Present Illness   The patient, with an unknown past medical history, presents with flu-like symptoms that began Saturday night and worsened on Sunday. She initially self-treated with Sudafed PE night and day tablets, which provided no relief. She then had a virtual visit with a physician who prescribed bromphed DM, fluticasone, albuterol that has provided some relief. However, she continues to experience chills, body aches, and lower anterior rib pain, which she attributes to a persistent cough. She also reports feeling hot, but has not taken her temperature at home. She has tested negative for COVID-19 at home. 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          Health Maintenance Due  Topic Date Due   Hepatitis C Screening  Never done   INFLUENZA VACCINE  Never done   COVID-19 Vaccine (3 - 2023-24 season) 09/21/2022    Past Medical History:  Diagnosis Date   GERD (gastroesophageal reflux disease)    H/O gastroesophageal reflux (GERD)    Herpes simplex type 2 infection    History of stomach ulcers    History of syphilis    Iron deficiency anemia 11/01/2018    Past Surgical History:  Procedure Laterality Date   ESOPHAGOGASTRODUODENOSCOPY     to evaluate for ulcers   MYOMECTOMY N/A 05/22/2020   Procedure: ABDOMINAL MYOMECTOMY;  Surgeon: Willodean Rosenthal, MD;  Location: [facility] OR;  Service: Gynecology;  Laterality: N/A;   OTHER SURGICAL HISTORY  2015   boil removed from buttock    Family  History  Problem Relation Age of Onset   Hypertension Mother    Diabetes Mother    Hypertension Father    Asthma Father    Hyperlipidemia Father    Diabetes Father    Hypertension Brother    Hypertension Brother    Hypertension Maternal Grandmother    Diabetes Maternal Grandmother    Seizures Maternal Grandmother    Colon cancer Paternal Grandfather    Colon cancer Paternal Aunt     Social History   Socioeconomic History   Marital status: Single    Spouse name: Not on file   Number of children: 0   Years of education: Not on file   Highest education level: Associate degree: academic program  Occupational History   Occupation: Merchandiser, retail  Tobacco Use   Smoking status: Every Day    Current packs/day: 0.50    Average packs/day: 0.5 packs/day for 22.8 years (11.4 ttl pk-yrs)    Types: Cigarettes    Start date: 01/21/2000   Smokeless tobacco: Never  Vaping Use   Vaping status: Never Used  Substance and Sexual Activity   Alcohol use: Yes    Alcohol/week: 1.0 standard drink of alcohol    Types: 1 Standard drinks or equivalent per week    Comment: once a month   Drug use: Never   Sexual activity: Yes    Partners: Female  Other Topics Concern   Not on  file  Social History Narrative   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   Grew up In [place] online   Was raised by her grandmother   Lives with female partner   Social Determinants of Health   Financial Resource Strain: Low Risk  (11/11/2022)   Overall Financial Resource Strain (CARDIA)    Difficulty of Paying Living Expenses: Not very hard  Food Insecurity: No Food Insecurity (11/11/2022)   Hunger Vital Sign    Worried About Running Out of Food in the Last Year: Never true    Ran Out of Food in the Last Year: Never true  Transportation Needs: No Transportation Needs (11/11/2022)   PRAPARE - Administrator, Civil Service (Medical): No    Lack of Transportation (Non-Medical): No   Physical Activity: Insufficiently Active (11/11/2022)   Exercise Vital Sign    Days of Exercise per Week: 3 days    Minutes of Exercise per Session: 20 min  Stress: No Stress Concern Present (11/11/2022)   [employer] of Occupational Health - Occupational Stress Questionnaire    Feeling of Stress : Not at all  Social Connections: Unknown (11/11/2022)   Social Connection and Isolation Panel [NHANES]    Frequency of Communication with Friends and Family: More than three times a week    Frequency of Social Gatherings with Friends and Family: Once a week    Attends Religious Services: Never    Database administrator or Organizations: No    Attends Engineer, structural: Not on file    Marital Status: Patient declined  Intimate Partner Violence: Not on file    Outpatient Medications Prior to Visit  Medication Sig Dispense Refill   albuterol (VENTOLIN HFA) 108 (90 Base) MCG/ACT inhaler Inhale 1-2 puffs into the lungs every 6 (six) hours as needed. 8 g 0   brompheniramine-pseudoephedrine-DM 30-2-10 MG/5ML syrup Take 5 mLs by mouth 4 (four) times daily as needed. 120 mL 0   cetirizine (ZYRTEC) 10 MG tablet Take 1 tablet (10 mg total) by mouth daily. 90 tablet 1   clotrimazole (LOTRIMIN) 1 % cream Apply 1 Application topically daily as needed (athlete's foot). 30 g 1   Fe Fum-FePoly-FA-Vit C-Vit B3 (INTEGRA F) 125-1 MG CAPS Take 1 capsule by mouth daily. 30 capsule 6   fluticasone (FLONASE) 50 MCG/ACT nasal spray Place 2 sprays into both nostrils daily. 16 g 0   pantoprazole (PROTONIX) 40 MG tablet Take 1 tablet (40 mg total) by mouth daily. 30 tablet 3   valACYclovir (VALTREX) 500 MG tablet Take 1 tablet (500 mg total) by mouth daily. 90 tablet 1   No facility-administered medications prior to visit.    No Known Allergies  Review of Systems  Respiratory:  Positive for cough.        Objective:    Physical Exam Constitutional:      General: She is not in acute  distress.    Appearance: Normal appearance. She is well-developed.  HENT:     Head: Normocephalic and atraumatic.     Right Ear: Tympanic membrane, ear canal and external ear normal.     Left Ear: Tympanic membrane, ear canal and external ear normal.     Mouth/Throat:     Mouth: Mucous membranes are moist.     Pharynx: Oropharynx is clear. Uvula midline.     Tonsils: No tonsillar exudate or tonsillar abscesses. 2+ on the right. 2+ on the left.  Eyes:  General: No scleral icterus. Neck:     Thyroid: No thyromegaly.  Cardiovascular:     Rate and Rhythm: Normal rate and regular rhythm.     Heart sounds: Normal heart sounds. No murmur heard. Pulmonary:     Effort: Pulmonary effort is normal. No respiratory distress.     Breath sounds: Normal breath sounds. No wheezing.  Musculoskeletal:     Cervical back: Neck supple.  Skin:    General: Skin is warm and dry.  Neurological:     Mental Status: She is alert and oriented to person, place, and time.  Psychiatric:        Mood and Affect: Mood normal.        Behavior: Behavior normal.        Thought Content: Thought content normal.        Judgment: Judgment normal.      BP 130/88 (BP Location: Right Arm, Patient Position: Sitting, Cuff Size: Normal)   Pulse 92   Temp 98.5 F (36.9 C) (Oral)   Resp 18   Wt 144 lb (65.3 kg)   SpO2 100%   BMI 25.51 kg/m  Wt Readings from Last 3 Encounters:  11/11/22 144 lb (65.3 kg)  08/05/22 143 lb 4 oz (65 kg)  04/04/22 143 lb (64.9 kg)       Assessment & Plan:   Problem List Items Addressed This Visit       Unprioritized   Viral URI    Need to rule out PNA.  Will plan to treat with abx if CXR positive for PNA.  Recommend rest and hydration. Flu swab is negative. Call if symptoms worsening or if not improved in 2-3 days.       Lung field abnormal finding on examination - Primary   Relevant Orders   DG Chest 2 View   POCT Influenza A/B (Completed)    I am having Yanina L.  Don "O'Tika" maintain her valACYclovir, Integra F, cetirizine, pantoprazole, clotrimazole, albuterol, brompheniramine-pseudoephedrine-DM, and fluticasone.  No orders of the defined types were placed in this encounter.

## 2022-11-11 NOTE — Patient Instructions (Signed)
VISIT SUMMARY:  You came in today with flu-like symptoms that started on Saturday night and have worsened. You have been experiencing chills, body aches, a persistent cough, and rib pain. You also feel hot but have not taken your temperature. You tested negative for COVID-19 at home. Your partner has similar symptoms but has been able to work through them. 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  YOUR PLAN:  -ACUTE RESPIRATORY ILLNESS: An acute respiratory illness is a sudden onset of symptoms affecting the respiratory system, such as chills, body aches, cough, and fever. We will perform a chest x-ray to rule out pneumonia and an in-office flu swab to check for influenza. In the meantime, please rest and stay hydrated. We will review the results of the diagnostic tests and adjust your treatment plan as necessary.  INSTRUCTIONS:  Please follow up with [place] after your chest x-ray and flu swab so we can review the results and adjust your treatment plan if needed.

## 2022-11-11 NOTE — Assessment & Plan Note (Signed)
Need to rule out PNA.  Will plan to treat with abx if CXR positive for PNA.  Recommend rest and hydration. Flu swab is negative. Call if symptoms worsening or if not improved in 2-3 days.

## 2022-11-21 ENCOUNTER — Encounter: Payer: Self-pay | Admitting: Family

## 2022-11-21 MED ORDER — FLUCONAZOLE 150 MG PO TABS: ORAL_TABLET | ORAL | 0 refills | Status: DC

## 2022-12-09 ENCOUNTER — Other Ambulatory Visit: Payer: Self-pay | Admitting: Family

## 2022-12-09 DIAGNOSIS — D500 Iron deficiency anemia secondary to blood loss (chronic): Secondary | ICD-10-CM

## 2022-12-09 DIAGNOSIS — D219 Benign neoplasm of connective and other soft tissue, unspecified: Secondary | ICD-10-CM

## 2022-12-09 DIAGNOSIS — N920 Excessive and frequent menstruation with regular cycle: Secondary | ICD-10-CM

## 2022-12-31 ENCOUNTER — Other Ambulatory Visit: Payer: Self-pay | Admitting: Family

## 2022-12-31 DIAGNOSIS — R1013 Epigastric pain: Secondary | ICD-10-CM

## 2023-06-02 ENCOUNTER — Other Ambulatory Visit: Payer: Self-pay | Admitting: Family

## 2023-06-02 DIAGNOSIS — R1013 Epigastric pain: Secondary | ICD-10-CM

## 2023-06-07 ENCOUNTER — Other Ambulatory Visit: Payer: Self-pay | Admitting: Family

## 2023-07-06 ENCOUNTER — Other Ambulatory Visit (HOSPITAL_COMMUNITY): Payer: Self-pay

## 2023-07-06 ENCOUNTER — Other Ambulatory Visit: Payer: Self-pay | Admitting: Family

## 2023-07-06 MED ORDER — VALACYCLOVIR HCL 500 MG PO TABS
500.0000 mg | ORAL_TABLET | Freq: Every day | ORAL | 0 refills | Status: DC
  Filled 2023-07-06: qty 30, 30d supply, fill #0

## 2023-07-07 ENCOUNTER — Other Ambulatory Visit (HOSPITAL_COMMUNITY): Payer: Self-pay

## 2023-07-07 ENCOUNTER — Other Ambulatory Visit: Payer: Self-pay

## 2023-07-10 ENCOUNTER — Encounter: Payer: Self-pay | Admitting: Family

## 2023-08-13 ENCOUNTER — Encounter: Payer: Self-pay | Admitting: Family

## 2023-08-13 DIAGNOSIS — R1013 Epigastric pain: Secondary | ICD-10-CM

## 2023-08-14 ENCOUNTER — Other Ambulatory Visit: Payer: Self-pay | Admitting: Family

## 2023-08-14 MED ORDER — CETIRIZINE HCL 10 MG PO TABS: 10.0000 mg | ORAL_TABLET | Freq: Every day | ORAL | 1 refills | Status: AC

## 2023-08-14 MED ORDER — PANTOPRAZOLE SODIUM 40 MG PO TBEC
40.0000 mg | DELAYED_RELEASE_TABLET | Freq: Two times a day (BID) | ORAL | 0 refills | Status: AC
Start: 2023-08-14 — End: ?

## 2023-08-14 NOTE — Telephone Encounter (Signed)
 Copied from CRM [id number]. Topic: Clinical - Medication Refill >> Aug 14, 2023 10:46 AM Thersia C wrote: Medication: cetirizine  (ZYRTEC ) 10 MG tablet  Has the patient contacted their pharmacy? Yes (Agent: If no, request that the patient contact the pharmacy for the refill. If patient does not wish to contact the pharmacy document the reason why and proceed with request.) (Agent: If yes, when and what did the pharmacy advise?)  This is the patient's preferred pharmacy:    [facility] ST[id number] [address], [place] - [address] AT [facility] [address] [place] [address] Phone: [phone] Fax: [phone]  Is this the correct pharmacy for this prescription? Yes If no, delete pharmacy and type the correct one.   Has the prescription been filled recently? No  Is the patient out of the medication? Yes  Has the patient been seen for an appointment in the last year OR does the patient have an upcoming appointment? Yes  Can we respond through MyChart? Yes  Agent: Please be advised that Rx refills may take up to 3 business days. We ask that you follow-up with your pharmacy.

## 2023-08-17 ENCOUNTER — Other Ambulatory Visit: Payer: Self-pay | Admitting: Family

## 2023-08-18 ENCOUNTER — Other Ambulatory Visit (HOSPITAL_COMMUNITY): Payer: Self-pay

## 2023-08-18 ENCOUNTER — Other Ambulatory Visit: Payer: Self-pay

## 2023-08-18 ENCOUNTER — Encounter: Payer: Self-pay | Admitting: Pharmacist

## 2023-08-18 MED ORDER — VALACYCLOVIR HCL 500 MG PO TABS
500.0000 mg | ORAL_TABLET | Freq: Every day | ORAL | 0 refills | Status: DC
  Filled 2023-08-18: qty 30, 30d supply, fill #0

## 2023-08-20 ENCOUNTER — Other Ambulatory Visit (HOSPITAL_COMMUNITY): Payer: Self-pay

## 2023-08-20 ENCOUNTER — Encounter (HOSPITAL_COMMUNITY): Payer: Self-pay

## 2023-10-01 ENCOUNTER — Encounter: Payer: Self-pay | Admitting: Family

## 2023-10-01 ENCOUNTER — Ambulatory Visit: Payer: Self-pay

## 2023-10-01 NOTE — Telephone Encounter (Signed)
 Appt scheduled

## 2023-10-01 NOTE — Telephone Encounter (Signed)
 FYI Only or Action Required?: FYI only for provider.  Patient was last seen in primary care on 11/11/2022 by Daryl Setter, NP.  Called Nurse Triage reporting Panic Attack.  Symptoms began several months ago.  Interventions attempted: Rest, hydration, or home remedies.  Symptoms are: gradually worsening.  Triage Disposition: See PCP When Office is Open (Within 3 Days)  Patient/caregiver understands and will follow disposition?: Yes  **Patient scheduled for 9/12, she does not have Co-Pay money for the visit and would like to discuss options with office staff**            Copied from CRM #[id number]. Topic: Clinical - Red Word Triage >> Oct 01, 2023  3:06 PM [place] E wrote: [facility] that prompted transfer to Nurse Triage: Panic attacks, going on for the past couple of months, progressively getting more common. Reason for Disposition  MODERATE anxiety (e.g., persistent or frequent anxiety symptoms; interferes with sleep, school, or work)  [1] Anxiety symptoms AND [2] has not been evaluated for this by doctor (or NP/PA)  Answer Assessment - Initial Assessment Questions 1. CONCERN: Did anything happen that prompted you to call today?      Increased panic attacks   2. ANXIETY SYMPTOMS: Can you describe how you (your loved one; patient) have been feeling? (e.g., tense, restless, panicky, anxious, keyed up, overwhelmed, sense of impending doom).       She reports a couple of nights ago her heart began racing fast; more anxious.   3. ONSET: How long have you been feeling this way? (e.g., hours, days, weeks)     X 2 months   4. SEVERITY: How would you rate the level of anxiety? (e.g., 0 - 10; or mild, moderate, severe).     5/10   5. FUNCTIONAL IMPAIRMENT: How have these feelings affected your ability to do daily activities? Have you had more difficulty than usual doing your normal daily activities? (e.g., getting better, same, worse; self-care, school, work,  interactions)     No   6. HISTORY: Have you felt this way before? Have you ever been diagnosed with an anxiety problem in the past? (e.g., generalized anxiety disorder, panic attacks, PTSD). If Yes, ask: How was this problem treated? (e.g., medicines, counseling, etc.)     No    7. RISK OF HARM - SUICIDAL IDEATION: Do you ever have thoughts of hurting or killing yourself? If Yes, ask:  Do you have these feelings now? Do you have a plan on how you would do this?     No   8. TREATMENT:  What has been done so far to treat this anxiety? (e.g., medicines, relaxation strategies). What has helped?     Relaxation techniques   9. THERAPIST: Do you have a counselor or therapist? If Yes, ask: What is their name?     No    Patient scheduled for appt. On 9/12; patient wants the office to know that she does not have Co-Pay money at the moment, and would like to discuss payment with office staff.   10. POTENTIAL TRIGGERS: Do you drink caffeinated beverages (e.g., coffee, colas, teas), and how much daily? Do you drink alcohol or use any drugs? Have you started any new medicines recently?       Yes, increased caffeine   11. PATIENT SUPPORT: Who is with you now? Who do you live with? Do you have family or friends who you can talk to?        Yes  12. OTHER SYMPTOMS: Do you have any other symptoms? (e.g., feeling depressed, trouble concentrating, trouble sleeping, trouble breathing, palpitations or fast heartbeat, chest pain, sweating, nausea, or diarrhea)   Palpitations intermittently, trouble sleeping.  Protocols used: Anxiety and Panic Attack-A-AH

## 2023-10-02 ENCOUNTER — Encounter: Payer: Self-pay | Admitting: Student

## 2023-10-02 ENCOUNTER — Ambulatory Visit (INDEPENDENT_AMBULATORY_CARE_PROVIDER_SITE_OTHER): Admitting: Student

## 2023-10-02 VITALS — BP 117/73 | HR 89 | Temp 98.0°F | Ht 63.0 in | Wt 146.6 lb

## 2023-10-02 DIAGNOSIS — F419 Anxiety disorder, unspecified: Secondary | ICD-10-CM | POA: Diagnosis not present

## 2023-10-02 MED ORDER — HYDROXYZINE PAMOATE 25 MG PO CAPS: 25.0000 mg | ORAL_CAPSULE | Freq: Three times a day (TID) | ORAL | 0 refills | Status: AC | PRN

## 2023-10-02 NOTE — Progress Notes (Signed)
 Acute Office Visit  Subjective:     Patient ID: Emily Robbins, female    DOB: [birth year], 43 y.o.   MRN: [id number]  No chief complaint on file.   HPI Patient is in today for acute visit  History of Present Illness Emily Robbins is a 43 year old female who presents with panic attacks.  She experiences panic attacks characterized by a sudden feeling of impending doom, shortness of breath, and chest tightening. These have occurred approximately four times over the past two months, mostly at home, with one episode at work. She manages these episodes with cold compresses, showers, and breathing exercises. She identifies increased stress from her job and school, particularly a challenging business statistics class, as potential triggers. 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 She reports adequate sleep but acknowledges stress from responsibilities and upcoming events, such as a funeral, which may contribute to her anxiety.        10/02/2023    9:06 AM  GAD 7 : Generalized Anxiety Score  Nervous, Anxious, on Edge 3  Control/stop worrying 2  Worry too much - different things 3  Trouble relaxing 2  Restless 1  Easily annoyed or irritable 1  Afraid - awful might happen 1  Total GAD 7 Score 13  Anxiety Difficulty Not difficult at all        10/02/2023    9:05 AM 06/12/2021    9:09 AM 07/19/2019    2:30 PM  Depression screen PHQ 2/9  Decreased Interest 0 0 0  Down, Depressed, Hopeless 0 0 0  PHQ - 2 Score 0 0 0  Altered sleeping 1    Tired, decreased energy 0    Change in appetite 0    Feeling bad or failure about yourself  0    Trouble concentrating 1    Moving slowly or fidgety/restless 0    Suicidal thoughts 0    PHQ-9 Score  2    Difficult doing work/chores Not difficult at all       ROS      Objective:    BP 117/73   Pulse 89   Temp 98 F (36.7 C)   Ht 5' 3 (1.6 m)   Wt 146 lb 9.6 oz (66.5 kg)   SpO2 100%   BMI 25.97 kg/m    Physical Exam  General: No acute distress. Awake and conversant.  Eyes: Normal conjunctiva, anicteric. Round symmetric pupils.   Respiratory: CTAB. Respirations are non-labored. No wheezing.  Skin: Warm. No rashes or ulcers.  Psych: Alert and oriented. Cooperative, Appropriate mood and affect, Normal judgment.  CV: RRR. No murmur. No lower extremity edema.  MSK: Normal ambulation. No clubbing or cyanosis.  Neuro:  CN II-XII grossly normal.    No results found for any visits on 10/02/23.      Assessment & Plan:   Problem List Items Addressed This Visit   None Visit Diagnoses       Anxiety    -  Primary   Relevant Medications   hydrOXYzine  (VISTARIL ) 25 MG capsule      Panic attacks Experiencing situational panic attacks due to increased stress.  - Prescribed hydroxyzine  25 mg prn - Discussed ashwagandha daily as a supplement, noting effects may take  6-8 weeks for full effect. - Advised against hydroxyzine  with alcohol. - Emphasized importance of sleep for anxiety management. - Encouraged follow-up with primary care for treatment monitoring.    Meds ordered this encounter  Medications   hydrOXYzine  (VISTARIL ) 25 MG capsule    Sig: Take 1 capsule (25 mg total) by mouth every 8 (eight) hours as needed for anxiety.    Dispense:  30 capsule    Refill:  0    Supervising Provider:   DOMENICA BLACKBIRD A [[id number]]    No follow-ups on file.  Chellsea Beckers L Annalis Kaczmarczyk, NP

## 2023-10-28 DIAGNOSIS — F4322 Adjustment disorder with anxiety: Secondary | ICD-10-CM | POA: Diagnosis not present

## 2023-11-04 ENCOUNTER — Other Ambulatory Visit: Payer: Self-pay

## 2023-11-04 ENCOUNTER — Other Ambulatory Visit (HOSPITAL_COMMUNITY): Payer: Self-pay

## 2023-11-04 ENCOUNTER — Other Ambulatory Visit: Payer: Self-pay | Admitting: Family

## 2023-11-04 MED ORDER — VALACYCLOVIR HCL 500 MG PO TABS
500.0000 mg | ORAL_TABLET | Freq: Every day | ORAL | 0 refills | Status: AC
  Filled 2023-11-04: qty 90, 90d supply, fill #0

## 2023-11-15 ENCOUNTER — Emergency Department (HOSPITAL_BASED_OUTPATIENT_CLINIC_OR_DEPARTMENT_OTHER)

## 2023-11-15 ENCOUNTER — Encounter (HOSPITAL_BASED_OUTPATIENT_CLINIC_OR_DEPARTMENT_OTHER): Payer: Self-pay

## 2023-11-15 ENCOUNTER — Emergency Department (HOSPITAL_BASED_OUTPATIENT_CLINIC_OR_DEPARTMENT_OTHER)
Admission: EM | Admit: 2023-11-15 | Discharge: 2023-11-15 | Disposition: A | Discharge status: Home / Self Care | Attending: Emergency Medicine | Admitting: Emergency Medicine

## 2023-11-15 ENCOUNTER — Other Ambulatory Visit: Payer: Self-pay

## 2023-11-15 DIAGNOSIS — M25561 Pain in right knee: Secondary | ICD-10-CM | POA: Insufficient documentation

## 2023-11-15 DIAGNOSIS — W540XXA Bitten by dog, initial encounter: Secondary | ICD-10-CM | POA: Diagnosis not present

## 2023-11-15 DIAGNOSIS — M25551 Pain in right hip: Secondary | ICD-10-CM | POA: Diagnosis not present

## 2023-11-15 DIAGNOSIS — Z23 Encounter for immunization: Secondary | ICD-10-CM | POA: Insufficient documentation

## 2023-11-15 DIAGNOSIS — S81832A Puncture wound without foreign body, left lower leg, initial encounter: Secondary | ICD-10-CM | POA: Diagnosis present

## 2023-11-15 DIAGNOSIS — M79662 Pain in left lower leg: Secondary | ICD-10-CM | POA: Diagnosis not present

## 2023-11-15 MED ORDER — ACETAMINOPHEN 500 MG PO TABS
1000.0000 mg | ORAL_TABLET | Freq: Once | ORAL | Status: AC
Start: 2023-11-15 — End: 2023-11-15
  Administered 2023-11-15: 1000 mg via ORAL
  Filled 2023-11-15: qty 2

## 2023-11-15 MED ORDER — AMOXICILLIN-POT CLAVULANATE 875-125 MG PO TABS
1.0000 | ORAL_TABLET | Freq: Once | ORAL | Status: AC
  Administered 2023-11-15: 1 via ORAL
  Filled 2023-11-15: qty 1

## 2023-11-15 MED ORDER — LIDOCAINE-EPINEPHRINE (PF) 2 %-1:200000 IJ SOLN
20.0000 mL | Freq: Once | INTRAMUSCULAR | Status: AC
  Administered 2023-11-15: 20 mL
  Filled 2023-11-15: qty 20

## 2023-11-15 MED ORDER — TETANUS-DIPHTH-ACELL PERTUSSIS 5-2-15.5 LF-MCG/0.5 IM SUSP
0.5000 mL | Freq: Once | INTRAMUSCULAR | Status: AC
  Administered 2023-11-15: 0.5 mL via INTRAMUSCULAR
  Filled 2023-11-15: qty 0.5

## 2023-11-15 MED ORDER — AMOXICILLIN-POT CLAVULANATE 875-125 MG PO TABS: 1.0000 | ORAL_TABLET | Freq: Two times a day (BID) | ORAL | 0 refills | Status: AC

## 2023-11-15 MED ORDER — HYDROCODONE-ACETAMINOPHEN 5-325 MG PO TABS
1.0000 | ORAL_TABLET | Freq: Once | ORAL | Status: DC
  Filled 2023-11-15: qty 1

## 2023-11-15 NOTE — ED Notes (Signed)
 Pt d/c instructions, medications, and follow-up care reviewed with pt. Pt verbalized understanding and had no further questions at time of d/c. Pt CA&Ox4, ambulatory, and in NAD at time of d/c

## 2023-11-15 NOTE — ED Triage Notes (Signed)
 Pt reports being bit lower leg by dog. Pt unknown if rabies shot.

## 2023-11-15 NOTE — ED Provider Notes (Addendum)
 [place] EMERGENCY DEPARTMENT AT [facility] Provider Note   CSN: [id number] Arrival date & time: 11/15/23  1752     Patient presents with: Animal Bite (/)  HPI Emily Robbins is a 43 y.o. female presenting for dog bite to the left lower leg.  Occurred a couple hours ago.  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  She is confident that this is a pharmacist, community.  Reporting multiple bites to the anterior posterior lower leg.  Unsure of her last tetanus shot.  Denies any numbness or weakness in her leg.  Bleeding is controlled.  Able to ambulate and bear weight.  She does report some right knee pain as well as she did slip and fall backwards and landed on her knee.  Denies head injury or loss of consciousness.    Animal Bite      Prior to Admission medications   Medication Sig Start Date End Date Taking? Authorizing Provider  amoxicillin -clavulanate (AUGMENTIN) 875-125 MG tablet Take 1 tablet by mouth every 12 (twelve) hours for 9 days. 11/15/23 11/24/23 Yes Aleiya Rye K, PA-C  albuterol  (VENTOLIN  HFA) 108 (90 Base) MCG/ACT inhaler Inhale 1-2 puffs into the lungs every 6 (six) hours as needed. Patient not taking: Reported on 10/02/2023 11/10/22   Vivienne Delon HERO, PA-C  brompheniramine-pseudoephedrine-DM 30-2-10 MG/5ML syrup Take 5 mLs by mouth 4 (four) times daily as needed. 11/10/22   Vivienne Delon HERO, PA-C  cetirizine  (ZYRTEC ) 10 MG tablet Take 1 tablet (10 mg total) by mouth daily. Patient not taking: Reported on 10/02/2023 08/14/23   Daryl Setter, NP  clotrimazole  (LOTRIMIN ) 1 % cream APPLY 1 APPLICATION TOPICALLY DAILY AS NEEDED (ATHLETE'S FOOT). Patient not taking: Reported on 10/02/2023 06/08/23   O'Sullivan, Melissa, NP  Fe Fum-FePoly-FA-Vit C-Vit B3 (INTEGRA F ) 125-1 MG CAPS TAKE 1 CAPSULE BY MOUTH DAILY Patient not taking: Reported on 10/02/2023 12/10/22   O'Sullivan, Melissa, NP   fluconazole  (DIFLUCAN ) 150 MG tablet Take 1 tablet by mouth for vaginal yeast infection. May repeat in 3 days if symptoms persist. 11/21/22   Daryl Setter, NP  fluticasone  (FLONASE ) 50 MCG/ACT nasal spray Place 2 sprays into both nostrils daily. Patient not taking: Reported on 10/02/2023 11/10/22   Vivienne Delon HERO, PA-C  hydrOXYzine  (VISTARIL ) 25 MG capsule Take 1 capsule (25 mg total) by mouth every 8 (eight) hours as needed for anxiety. 10/02/23   Yacopino, Jessica L, NP  pantoprazole  (PROTONIX ) 40 MG tablet Take 1 tablet (40 mg total) by mouth 2 (two) times daily. 08/14/23   O'Sullivan, Melissa, NP  valACYclovir  (VALTREX ) 500 MG tablet Take 1 tablet (500 mg total) by mouth daily. 11/04/23   O'Sullivan, Melissa, NP    Allergies: Patient has no known allergies.    Review of Systems See HPI   Physical Exam   Vitals:   11/15/23 1757  BP: (!) 147/97  Pulse: (!) 104  Resp: 17  Temp: 97.8 F (36.6 C)  SpO2: 97%    CONSTITUTIONAL:  well-appearing, NAD NEURO:  Alert and oriented x 3, CN 3-12 grossly intact EYES:  eyes equal and reactive ENT/NECK:  Supple, no stridor  CARDIO: regular rate and rhythm, appears well-perfused  PULM:  No respiratory distress, CTAB GI/GU:  non-distended MSK/SPINE:  No gross deformities, no edema, moves all extremities, no swelling or erythema or obvious deformity of the right knee.  Able to ambulate and bear weight SKIN: Multiple  puncture wounds to the left lower leg most notably the wound to the posterior calf seems to be the largest (approximately 0.75 inches in size) the other wounds are more superficial.  See pictures below.          *Additional and/or pertinent findings included in MDM below    (all labs ordered are listed, but only abnormal results are displayed) Labs Reviewed - No data to display  EKG: None  Radiology: DG Knee Complete 4 Views Right Result Date: 11/15/2023 EXAM: 4 Or More View(s) Xray Of The Knee 11/15/2023  08:10:00 Pm COMPARISON: None Available. CLINICAL HISTORY: Pain. Bit On Lower Left Leg By Dog. Clemens And Landed On Right Knee. FINDINGS: BONES AND JOINTS: No Acute Fracture. No Focal Osseous Lesion. No Joint Dislocation. No Significant Joint Effusion. No Significant Degenerative Changes. SOFT TISSUES: The Soft Tissues Are Unremarkable. IMPRESSION: 1. No acute findings. Electronically signed by: Dorethia Molt MD 11/15/2023 08:15 PM EDT RP Workstation: [id number]   DG Tibia/Fibula Left Result Date: 11/15/2023 EXAM: 2 VIEW(S) XRAY OF THE  LEFT Tibia and fibula COMPARISON: None available. CLINICAL HISTORY: FINDINGS: BONES AND JOINTS: No acute fracture. No focal osseous lesion. No joint dislocation. SOFT TISSUES: Subcutaneous gas is seen within the lateral aspect of the left lower extremity at the level of the mid diaphysis of the fibula. The soft tissues are otherwise unremarkable. No retained radiopaque foreign body. IMPRESSION: 1. Subcutaneous gas in the lateral aspect of the left lower extremity at the level of the mid diaphysis of the fibula. No retained radiopaque foreign body. Electronically signed by: Dorethia Molt MD 11/15/2023 08:15 PM EDT RP Workstation: [id number]     Procedures   Medications Ordered in the ED  lidocaine -EPINEPHrine (XYLOCAINE  W/EPI) 2 %-1:200000 (PF) injection 20 mL (20 mLs Infiltration Given 11/15/23 1919)  Tdap (ADACEL) injection 0.5 mL (0.5 mLs Intramuscular Given 11/15/23 1918)  amoxicillin -clavulanate (AUGMENTIN) 875-125 MG per tablet 1 tablet (1 tablet Oral Given 11/15/23 1916)  acetaminophen  (TYLENOL ) tablet 1,000 mg (1,000 mg Oral Given 11/15/23 1935)                                    Medical Decision Making Amount and/or Complexity of Data Reviewed Radiology: ordered.  Risk OTC drugs. Prescription drug management.   43 year old well-appearing female presenting for dog bite.  Exam notable for multiple puncture wounds presumably dog bite wounds to the left  lower leg on the anterior posterior aspect of the leg.  See pictures above.  All wounds were irrigated and cleaned copiously.  Left the wound open and suspect they will heal adequately by secondary intention. Started her on Augmentin.  Applied bandage.  Advised her to follow-up with her PCP.  Discussed return precautions.  Animal control was contacted and actively investigating the situation.  Discharged.     Final diagnoses:  Dog bite, initial encounter    ED Discharge Orders          Ordered    amoxicillin -clavulanate (AUGMENTIN) 875-125 MG tablet  Every 12 hours        11/15/23 2035               Lang Norleen POUR, PA-C 11/15/23 2038    Lang Norleen POUR, PA-C 11/15/23 2038    Mannie Pac T, DO 11/22/23 [phone]

## 2023-11-15 NOTE — Discharge Instructions (Addendum)
 Evaluation was overall reassuring.  I am starting on Augmentin to prevent infection.  Please follow-up your PCP.  Also animal control should be reaching out to you regarding this incident.  If there is any concern on their end for rabies in the dog then they will let you know and you should seek out medical treatment at this time it is not indicated.  Please continue to clean the wounds daily with soap and water.  They should heal on their own.  Did not close the wounds as there is a high risk of trapping bacteria in the area if it is closed.  If there are signs of infection increased redness swelling or pustular oozing or you develop a fever please return to the ED for further evaluation.

## 2023-11-28 DIAGNOSIS — F4322 Adjustment disorder with anxiety: Secondary | ICD-10-CM | POA: Diagnosis not present

## 2023-12-03 DIAGNOSIS — F4322 Adjustment disorder with anxiety: Secondary | ICD-10-CM | POA: Diagnosis not present

## 2023-12-23 ENCOUNTER — Ambulatory Visit: Admitting: Family

## 2023-12-23 VITALS — BP 123/75 | HR 87 | Temp 98.2°F | Resp 16 | Ht 62.0 in | Wt 152.0 lb

## 2023-12-23 DIAGNOSIS — D500 Iron deficiency anemia secondary to blood loss (chronic): Secondary | ICD-10-CM

## 2023-12-23 DIAGNOSIS — W540XXA Bitten by dog, initial encounter: Secondary | ICD-10-CM | POA: Diagnosis not present

## 2023-12-23 DIAGNOSIS — S81852A Open bite, left lower leg, initial encounter: Secondary | ICD-10-CM

## 2023-12-23 DIAGNOSIS — Z1231 Encounter for screening mammogram for malignant neoplasm of breast: Secondary | ICD-10-CM

## 2023-12-23 MED ORDER — AMOXICILLIN-POT CLAVULANATE 875-125 MG PO TABS: 1.0000 | ORAL_TABLET | Freq: Two times a day (BID) | ORAL | 0 refills | Status: DC

## 2023-12-23 NOTE — Assessment & Plan Note (Signed)
 Notes significant improvement in her heavy periods following myomectomy. She continues iron  supplement. Plan to repeat lab work at her  upcoming physical.

## 2023-12-23 NOTE — Assessment & Plan Note (Signed)
 I am concerned about the possibility of infection in the posterior wound as it has not closed. Will rx with augmentin . Pt is advised to let us  know if wound does not close in the next week or two.

## 2023-12-23 NOTE — Progress Notes (Signed)
 Subjective:     Patient ID: Emily Robbins, female    DOB: July 08, 1980, 43 y.o.   MRN: [id number]  Chief Complaint  Patient presents with   Animal Bite    Patient reports dog bite on 11/15/23. Left leg    Menstrual Problem    Patient reports getting her menstrual period one week early    HPI  Discussed the use of AI scribe software for clinical note transcription with the patient, who gave verbal consent to proceed.  History of Present Illness Emily Robbins is a 43 year old female who presents with a dog bite wound to ensure it is not infected.  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 The incident occurred when a dog, initially not visible, attacked her as she was returning to her vehicle. Immediate first aid was provided by the homeowner's mother using peroxide and alcohol. The wound, particularly on her back, itches significantly, and she is concerned about a potential infection as it has not healed as expected. She notes a brown substance when removing Band-Aids from the wound.  Her menstrual cycle came a week early and was different from her usual periods. She is not on birth control and reports zero chance of pregnancy. Her periods have been heavy for two days before tapering off, and she has been taking an over-the-counter iron  supplement due to previous mild anemia and low ferritin levels. She underwent surgery in the past which helped with her menstrual symptoms.  She has experienced anxiety, particularly during finals week and when flying alone for the first time. She is currently in therapy and did not take the previously prescribed medication for anxiety. She attributes her anxiety to specific stressors such as flying and is considering using alcohol to manage her anxiety before flights.      Health Maintenance Due  Topic Date Due   Hepatitis C Screening  Never done   Hepatitis B Vaccines 19-59 Average Risk (1 of  3 - 19+ 3-dose series) Never done   HPV VACCINES (1 - 3-dose SCDM series) Never done   Pneumococcal Vaccine (2 of 2 - PPSV23, PCV20, or PCV21) 05/10/2013   Mammogram  Never done   Influenza Vaccine  Never done   COVID-19 Vaccine (3 - 2025-26 season) 09/21/2023   Cervical Cancer Screening (HPV/Pap Cotest)  12/09/2023    Past Medical History:  Diagnosis Date   GERD (gastroesophageal reflux disease)    H/O gastroesophageal reflux (GERD)    Herpes simplex type 2 infection    History of stomach ulcers    History of syphilis    Iron  deficiency anemia 11/01/2018    Past Surgical History:  Procedure Laterality Date   ESOPHAGOGASTRODUODENOSCOPY     to evaluate for ulcers   MYOMECTOMY N/A 05/22/2020   Procedure: ABDOMINAL MYOMECTOMY;  Surgeon: Corene Coy, MD;  Location: [facility] OR;  Service: Gynecology;  Laterality: N/A;   OTHER SURGICAL HISTORY  2015   boil removed from buttock    Family History  Problem Relation Age of Onset   Hypertension Mother    Diabetes Mother    Hypertension Father    Asthma Father    Hyperlipidemia Father    Diabetes Father    Hypertension Brother    Hypertension Brother    Hypertension Maternal Grandmother    Diabetes Maternal Grandmother    Seizures Maternal Grandmother    Colon cancer Paternal Grandfather    Colon cancer Paternal Aunt  Social History   Socioeconomic History   Marital status: Single    Spouse name: Not on file   Number of children: 0   Years of education: Not on file   Highest education level: Associate degree: academic program  Occupational History   Occupation: supervisor  Tobacco Use   Smoking status: Every Day    Current packs/day: 0.50    Average packs/day: 0.5 packs/day for 23.9 years (12.0 ttl pk-yrs)    Types: Cigarettes    Start date: 01/21/2000   Smokeless tobacco: Never  Vaping Use   Vaping status: Never Used  Substance and Sexual Activity   Alcohol use: Yes    Alcohol/week: 1.0 standard drink of  alcohol    Types: 1 Standard drinks or equivalent per week    Comment: once a month   Drug use: Never   Sexual activity: Yes    Partners: Female  Other Topics Concern   Not on file  Social History Narrative   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   Grew up In [place]   Was raised by her grandmother   Lives with female partner   Social Drivers of Corporate Investment Banker Strain: Low Risk  (11/11/2022)   Overall Financial Resource Strain (CARDIA)    Difficulty of Paying Living Expenses: Not very hard  Food Insecurity: No Food Insecurity (11/11/2022)   Hunger Vital Sign    Worried About Running Out of Food in the Last Year: Never true    Ran Out of Food in the Last Year: Never true  Transportation Needs: No Transportation Needs (11/11/2022)   PRAPARE - Administrator, Civil Service (Medical): No    Lack of Transportation (Non-Medical): No  Physical Activity: Insufficiently Active (11/11/2022)   Exercise Vital Sign    Days of Exercise per Week: 3 days    Minutes of Exercise per Session: 20 min  Stress: No Stress Concern Present (11/11/2022)   [employer] of Occupational Health - Occupational Stress Questionnaire    Feeling of Stress : Not at all  Social Connections: Unknown (11/11/2022)   Social Connection and Isolation Panel    Frequency of Communication with Friends and Family: More than three times a week    Frequency of Social Gatherings with Friends and Family: Once a week    Attends Religious Services: Never    Database Administrator or Organizations: No    Attends Engineer, Structural: Not on file    Marital Status: Patient declined  Catering Manager Violence: Not on file    Outpatient Medications Prior to Visit  Medication Sig Dispense Refill   albuterol  (VENTOLIN  HFA) 108 (90 Base) MCG/ACT inhaler Inhale 1-2 puffs into the lungs every 6 (six) hours as needed. 8 g 0    brompheniramine-pseudoephedrine-DM 30-2-10 MG/5ML syrup Take 5 mLs by mouth 4 (four) times daily as needed. 120 mL 0   cetirizine  (ZYRTEC ) 10 MG tablet Take 1 tablet (10 mg total) by mouth daily. 90 tablet 1   clotrimazole  (LOTRIMIN ) 1 % cream APPLY 1 APPLICATION TOPICALLY DAILY AS NEEDED (ATHLETE'S FOOT). 30 g 1   Fe Fum-FePoly-FA-Vit C-Vit B3 (INTEGRA F ) 125-1 MG CAPS TAKE 1 CAPSULE BY MOUTH DAILY 30 capsule 6   fluconazole  (DIFLUCAN ) 150 MG tablet Take 1 tablet by mouth for vaginal yeast infection. May repeat in 3 days if symptoms persist. 2 tablet 0   fluticasone  (FLONASE ) 50 MCG/ACT nasal spray Place 2 sprays  into both nostrils daily. 16 g 0   hydrOXYzine  (VISTARIL ) 25 MG capsule Take 1 capsule (25 mg total) by mouth every 8 (eight) hours as needed for anxiety. 30 capsule 0   pantoprazole  (PROTONIX ) 40 MG tablet Take 1 tablet (40 mg total) by mouth 2 (two) times daily. 60 tablet 0   valACYclovir  (VALTREX ) 500 MG tablet Take 1 tablet (500 mg total) by mouth daily. 90 tablet 0   No facility-administered medications prior to visit.    No Known Allergies  ROS    See HPI Objective:    Physical Exam Constitutional:      General: She is not in acute distress.    Appearance: Normal appearance. She is well-developed.  HENT:     Head: Normocephalic and atraumatic.     Right Ear: External ear normal.     Left Ear: External ear normal.  Eyes:     General: No scleral icterus. Neck:     Thyroid : No thyromegaly.  Cardiovascular:     Rate and Rhythm: Normal rate.     Heart sounds: No murmur heard. Pulmonary:     Effort: Pulmonary effort is normal.  Skin:    General: Skin is warm and dry.     Comments: Multiple wounds LLE due to dog bite with puncture wound noted lateral calf which is open in the middle. Other wounds appear closed/healing  Neurological:     Mental Status: She is alert and oriented to person, place, and time.  Psychiatric:        Mood and Affect: Mood normal.         Behavior: Behavior normal.        Thought Content: Thought content normal.        Judgment: Judgment normal.       BP 123/75 (BP Location: Right Arm, Patient Position: Sitting, Cuff Size: Normal)   Pulse 87   Temp 98.2 F (36.8 C) (Oral)   Resp 16   Ht 5' 2 (1.575 m)   Wt 152 lb (68.9 kg)   SpO2 100%   BMI 27.80 kg/m  Wt Readings from Last 3 Encounters:  12/23/23 152 lb (68.9 kg)  11/15/23 145 lb (65.8 kg)  10/02/23 146 lb 9.6 oz (66.5 kg)       Assessment & Plan:   Problem List Items Addressed This Visit       Unprioritized   Iron  deficiency anemia   Notes significant improvement in her heavy periods following myomectomy. She continues iron  supplement. Plan to repeat lab work at her  upcoming physical.       Dog bite - Primary   I am concerned about the possibility of infection in the posterior wound as it has not closed. Will rx with augmentin . Pt is advised to let us  know if wound does not close in the next week or two.       Relevant Medications   amoxicillin -clavulanate (AUGMENTIN ) 875-125 MG tablet   Other Visit Diagnoses       Breast cancer screening by mammogram       Relevant Orders   MM 3D SCREENING MAMMOGRAM BILATERAL BREAST       I am having Emily Robbins start on amoxicillin -clavulanate. I am also having her maintain her albuterol , brompheniramine-pseudoephedrine-DM, fluticasone , fluconazole , Integra F , clotrimazole , cetirizine , pantoprazole , hydrOXYzine , and valACYclovir .  Meds ordered this encounter  Medications   amoxicillin -clavulanate (AUGMENTIN ) 875-125 MG tablet    Sig: Take 1 tablet by mouth 2 (two) times  daily.    Dispense:  14 tablet    Refill:  0    Supervising Provider:   DOMENICA BLACKBIRD A [[id number]]

## 2023-12-23 NOTE — Patient Instructions (Signed)
  VISIT SUMMARY: Today, you were seen for a dog bite wound to ensure it is not infected. We also discussed your menstrual cycle irregularity, mild anemia, and anxiety. Additionally, we reviewed your general health maintenance needs.  YOUR PLAN: -DOG BITE WOUNDS: A dog bite wound can sometimes become infected, especially if it does not heal properly. You have been prescribed Augmentin  for one week to treat any potential infection. Please monitor the wound and report back if it does not close within one to two weeks.  -MENSTRUAL CYCLE IRREGULARITY: Occasional early or late periods are usually not a concern unless they become consistently irregular. If your cycles become consistently irregular, we may consider birth control to help regulate them.  -ANEMIA: Anemia is a condition where you do not have enough healthy red blood cells to carry adequate oxygen to your body's tissues. You have mild anemia with low ferritin levels and are currently taking over-the-counter iron  supplements. Please continue taking these supplements, and we will reassess your anemia during your upcoming physical exam.  -ANXIETY: Anxiety is a feeling of worry or fear that can be related to specific stressors. You are currently managing your anxiety well with therapy and prefer not to take medication. You may consider using alcohol to manage your anxiety before flights, but please do so cautiously.  -GENERAL HEALTH MAINTENANCE: For your general health maintenance, you are due for a Pap smear and a mammogram. We have scheduled these tests for you. Additionally, you plan to get a flu shot after the holidays.  INSTRUCTIONS: Please take the prescribed Augmentin  for one week and monitor your dog bite wound. If the wound does not close within one to two weeks, report back to us . Continue taking your over-the-counter iron  supplements, and we will reassess your anemia during your upcoming physical exam. We have scheduled your Pap smear and  mammogram, and you plan to get a flu shot after the holidays.

## 2023-12-30 DIAGNOSIS — F4322 Adjustment disorder with anxiety: Secondary | ICD-10-CM | POA: Diagnosis not present

## 2024-01-05 DIAGNOSIS — F4322 Adjustment disorder with anxiety: Secondary | ICD-10-CM | POA: Diagnosis not present

## 2024-01-13 ENCOUNTER — Other Ambulatory Visit: Payer: Self-pay | Admitting: Family

## 2024-02-26 ENCOUNTER — Encounter: Payer: Self-pay | Admitting: Family

## 2024-02-26 ENCOUNTER — Encounter: Admitting: Family

## 2024-02-26 VITALS — BP 122/80 | HR 77 | Temp 98.7°F | Resp 16 | Ht 62.0 in | Wt 153.0 lb

## 2024-02-26 DIAGNOSIS — Z720 Tobacco use: Secondary | ICD-10-CM

## 2024-02-26 DIAGNOSIS — K219 Gastro-esophageal reflux disease without esophagitis: Secondary | ICD-10-CM

## 2024-02-26 DIAGNOSIS — Z13220 Encounter for screening for lipoid disorders: Secondary | ICD-10-CM

## 2024-02-26 DIAGNOSIS — R635 Abnormal weight gain: Secondary | ICD-10-CM

## 2024-02-26 DIAGNOSIS — Z23 Encounter for immunization: Secondary | ICD-10-CM

## 2024-02-26 DIAGNOSIS — Z113 Encounter for screening for infections with a predominantly sexual mode of transmission: Secondary | ICD-10-CM

## 2024-02-26 DIAGNOSIS — Z0000 Encounter for general adult medical examination without abnormal findings: Secondary | ICD-10-CM | POA: Insufficient documentation

## 2024-02-26 DIAGNOSIS — Z01419 Encounter for gynecological examination (general) (routine) without abnormal findings: Secondary | ICD-10-CM

## 2024-02-26 DIAGNOSIS — R739 Hyperglycemia, unspecified: Secondary | ICD-10-CM

## 2024-02-26 DIAGNOSIS — Z1231 Encounter for screening mammogram for malignant neoplasm of breast: Secondary | ICD-10-CM

## 2024-02-26 DIAGNOSIS — D500 Iron deficiency anemia secondary to blood loss (chronic): Secondary | ICD-10-CM

## 2024-02-26 LAB — COMPREHENSIVE METABOLIC PANEL WITH GFR
ALT: 11 U/L (ref 3–35)
AST: 16 U/L (ref 5–37)
Albumin: 4.4 g/dL (ref 3.5–5.2)
Alkaline Phosphatase: 62 U/L (ref 39–117)
BUN: 16 mg/dL (ref 6–23)
CO2: 28 meq/L (ref 19–32)
Calcium: 9.1 mg/dL (ref 8.4–10.5)
Chloride: 105 meq/L (ref 96–112)
Creatinine, Ser: 0.88 mg/dL (ref 0.40–1.20)
GFR: 81.59 mL/min
Glucose, Bld: 81 mg/dL (ref 70–99)
Potassium: 4 meq/L (ref 3.5–5.1)
Sodium: 139 meq/L (ref 135–145)
Total Bilirubin: 0.2 mg/dL (ref 0.2–1.2)
Total Protein: 7.3 g/dL (ref 6.0–8.3)

## 2024-02-26 LAB — CBC WITH DIFFERENTIAL/PLATELET
Basophils Absolute: 0 10*3/uL (ref 0.0–0.1)
Basophils Relative: 0.4 % (ref 0.0–3.0)
Eosinophils Absolute: 0.2 10*3/uL (ref 0.0–0.7)
Eosinophils Relative: 2.9 % (ref 0.0–5.0)
HCT: 40 % (ref 36.0–46.0)
Hemoglobin: 12.6 g/dL (ref 12.0–15.0)
Lymphocytes Relative: 30.3 % (ref 12.0–46.0)
Lymphs Abs: 2.3 10*3/uL (ref 0.7–4.0)
MCHC: 31.4 g/dL (ref 30.0–36.0)
MCV: 83 fl (ref 78.0–100.0)
Monocytes Absolute: 0.7 10*3/uL (ref 0.1–1.0)
Monocytes Relative: 9.5 % (ref 3.0–12.0)
Neutro Abs: 4.3 10*3/uL (ref 1.4–7.7)
Neutrophils Relative %: 56.9 % (ref 43.0–77.0)
Platelets: 399 10*3/uL (ref 150.0–400.0)
RBC: 4.82 Mil/uL (ref 3.87–5.11)
RDW: 18.8 % — ABNORMAL HIGH (ref 11.5–15.5)
WBC: 7.6 10*3/uL (ref 4.0–10.5)

## 2024-02-26 LAB — IBC + FERRITIN
Ferritin: 10.3 ng/mL (ref 10.0–291.0)
Iron: 46 ug/dL (ref 42–145)
Saturation Ratios: 9.5 % — ABNORMAL LOW (ref 20.0–50.0)
TIBC: 484.4 ug/dL — ABNORMAL HIGH (ref 250.0–450.0)
Transferrin: 346 mg/dL (ref 212.0–360.0)

## 2024-02-26 LAB — LIPID PANEL
Cholesterol: 196 mg/dL (ref 28–200)
HDL: 50.4 mg/dL
LDL Cholesterol: 122 mg/dL — ABNORMAL HIGH (ref 10–99)
NonHDL: 145.4
Total CHOL/HDL Ratio: 4
Triglycerides: 115 mg/dL (ref 10.0–149.0)
VLDL: 23 mg/dL (ref 0.0–40.0)

## 2024-02-26 LAB — TSH: TSH: 1.39 u[IU]/mL (ref 0.35–5.50)

## 2024-02-26 LAB — HEMOGLOBIN A1C: Hgb A1c MFr Bld: 5.5 % (ref 4.6–6.5)

## 2024-02-26 NOTE — Assessment & Plan Note (Signed)
" °  Continues smoking one pack per day. Encouraged cessation. "

## 2024-02-26 NOTE — Assessment & Plan Note (Signed)
" °  Routine wellness visit with focus on health maintenance. - Administered pneumonia vaccine. - Ordered mammogram. - Encouraged healthy diet and exercise. - Discussed importance of regular eye and dental exams. - prevnar today. "

## 2024-02-26 NOTE — Patient Instructions (Signed)
" °  VISIT SUMMARY: During your annual physical exam and Pap smear, we discussed your general health and wellness. You received a pneumonia vaccine, and we ordered a mammogram. We also talked about your recent weight gain, diet, exercise, and the importance of regular eye and dental exams. Additionally, we addressed your iron  levels, thyroid  function, blood sugar levels, and ongoing management of gastroesophageal reflux disease (GERD).  YOUR PLAN: -WOMAN'S WELLNESS VISIT: This visit focused on your overall health maintenance. You received a pneumonia vaccine, and we ordered a mammogram. We also discussed the importance of maintaining a healthy diet and regular exercise, as well as the need for regular eye and dental exams.  -IRON  DEFICIENCY ANEMIA: Iron  deficiency anemia is a condition where your body lacks enough iron  to produce healthy red blood cells. We will monitor your blood count and iron  levels to ensure they are within a healthy range.  -ABNORMAL WEIGHT GAIN: We discussed your recent weight gain from 145 lbs to 153 lbs, which may be related to lifestyle factors. We checked your thyroid  function to rule out any thyroid  issues that could be contributing to the weight gain.  -HYPERGLYCEMIA: Hyperglycemia means having high blood sugar levels, which can be a sign of prediabetes. We ordered a diabetes test to check your blood sugar levels and assess your risk for diabetes.  -GASTROESOPHAGEAL REFLUX DISEASE (GERD): GERD is a condition where stomach acid frequently flows back into the tube connecting your mouth and stomach, causing irritation. We refilled your Pantoprazole  prescription to help manage this condition.  -TOBACCO USE: You continue to smoke one pack of cigarettes per day. We encourage you to consider quitting smoking for your overall health.  INSTRUCTIONS: Please follow up with the ordered mammogram and diabetes test. Continue taking your prescribed medications, including Pantoprazole  for  GERD. Maintain a healthy diet, exercise regularly, and schedule regular eye and dental exams. We will monitor your blood count, iron  levels, and thyroid  function. Consider reducing or quitting smoking for better health.    Contains text generated by [employer].   "

## 2024-02-26 NOTE — Progress Notes (Signed)
 "  Subjective:     Patient ID: Emily Robbins, female    DOB: 26-May-1980, 44 y.o.   MRN: [id number]  No chief complaint on file.   HPI  Discussed the use of AI scribe software for clinical note transcription with the patient, who gave verbal consent to proceed.  History of Present Illness  Emily Robbins is a 43 year old female who presents for an annual physical exam and Pap smear.  She feels generally well and has recently completed her menstrual period, returning to her baseline state. Her last Pap smear was in November 2020, and she has not yet undergone a mammogram.  She uses albuterol  as needed for chest symptoms during illnesses, though she has not been diagnosed with asthma. She reports a recent weight gain from 145 to 153 pounds, attributing it to decreased physical activity. She is attempting to improve her diet by increasing fruit intake, drinking more water, and avoiding fried foods.  Her family history includes her father having prostate cancer, though she lacks detailed information due to limited communication. She also mentions a cousin who passed away at 3 years old, reportedly from natural causes, but she is skeptical of this explanation.  Socially, she lives alone as her partner resides in [place] . 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 She smokes about a pack of cigarettes daily and consumes alcohol infrequently, about one or two drinks a month.  In the review of systems, she reports a recent head cold with stuffiness and sneezing, which resolved with medication. No current cough, cold symptoms, skin concerns, hearing or vision issues, digestive problems, urinary issues, or unusual muscle or joint pain. She experiences headaches related to her menstrual cycle but no other significant headaches. No current depression or anxiety.  She is currently taking Protonix , which she finds helpful, and takes a magnesium  glycinate gummy at night, which she finds beneficial.  Immunizations: Prevnar Diet: trying to eat more fruit and drink more water Wt Readings from Last 3 Encounters:  02/26/24 153 lb (69.4 kg)  12/23/23 152 lb (68.9 kg)  11/15/23 145 lb (65.8 kg)  Exercise: needs improvement.  Pap Smear: 11/2018 Mammogram: due Vision: due Dental: overdue, will schedule    Health Maintenance Due  Topic Date Due   Hepatitis C Screening  Never done   Hepatitis B Vaccines 19-59 Average Risk (1 of 3 - 19+ 3-dose series) Never done   Mammogram  Never done   COVID-19 Vaccine (3 - 2025-26 season) 09/21/2023   Cervical Cancer Screening (HPV/Pap Cotest)  12/09/2023    Past Medical History:  Diagnosis Date   Fibroids, intramural 05/22/2020   GERD (gastroesophageal reflux disease)    H/O gastroesophageal reflux (GERD)    Herpes simplex type 2 infection    History of stomach ulcers    History of syphilis    Iron  deficiency anemia 11/01/2018    Past Surgical History:  Procedure Laterality Date   ESOPHAGOGASTRODUODENOSCOPY     to evaluate for ulcers   MYOMECTOMY N/A 05/22/2020   Procedure: ABDOMINAL MYOMECTOMY;  Surgeon: Corene Coy, MD;  Location: [facility] OR;  Service: Gynecology;  Laterality: N/A;   OTHER SURGICAL HISTORY  2015   boil removed from buttock    Family History  Problem Relation Age of Onset   Hypertension Mother    Diabetes Mother    Hypertension Father    Asthma Father    Hyperlipidemia Father    Diabetes  Father    Prostate cancer Father    Hypertension Brother    Hypertension Brother    Hypertension Maternal Grandmother    Diabetes Maternal Grandmother    Seizures Maternal Grandmother    Colon cancer Paternal Grandfather    Colon cancer Paternal Aunt     Social History   Socioeconomic History   Marital status: Single    Spouse name: Not on file   Number of children: 0   Years of education: Not on file   Highest education level: Associate degree: academic  program  Occupational History   Occupation: merchandiser, retail  Tobacco Use   Smoking status: Every Day    Current packs/day: 1.00    Average packs/day: 1 pack/day for 24.1 years (24.1 ttl pk-yrs)    Types: Cigarettes    Start date: 01/21/2000   Smokeless tobacco: Never  Vaping Use   Vaping status: Never Used  Substance and Sexual Activity   Alcohol use: Yes    Alcohol/week: 1.0 standard drink of alcohol    Types: 1 Standard drinks or equivalent per week    Comment: twice a month   Drug use: Never   Sexual activity: Yes    Partners: Female  Other Topics Concern   Not on file  Social History Narrative   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   Was raised by her grandmother   Lives alone    Social Drivers of Health   Tobacco Use: High Risk (02/26/2024)   Patient History    Smoking Tobacco Use: Every Day    Smokeless Tobacco Use: Never    Passive Exposure: Not on file  Financial Resource Strain: Low Risk (11/11/2022)   Overall Financial Resource Strain (CARDIA)    Difficulty of Paying Living Expenses: Not very hard  Food Insecurity: No Food Insecurity (11/11/2022)   Hunger Vital Sign    Worried About Running Out of Food in the Last Year: Never true    Ran Out of Food in the Last Year: Never true  Transportation Needs: No Transportation Needs (11/11/2022)   PRAPARE - Administrator, Civil Service (Medical): No    Lack of Transportation (Non-Medical): No  Physical Activity: Insufficiently Active (11/11/2022)   Exercise Vital Sign    Days of Exercise per Week: 3 days    Minutes of Exercise per Session: 20 min  Stress: No Stress Concern Present (11/11/2022)   [employer] of Occupational Health - Occupational Stress Questionnaire    Feeling of Stress : Not at all  Social Connections: Unknown (11/11/2022)   Social Connection and Isolation Panel    Frequency of Communication with Friends and  Family: More than three times a week    Frequency of Social Gatherings with Friends and Family: Once a week    Attends Religious Services: Never    Database Administrator or Organizations: No    Attends Engineer, Structural: Not on file    Marital Status: Patient declined  Intimate Partner Violence: Not on file  Depression (PHQ2-9): Medium Risk (02/26/2024)   Depression (PHQ2-9)    PHQ-2 Score: 7  Alcohol Screen: Low Risk (11/11/2022)   Alcohol Screen    Last Alcohol Screening Score (AUDIT): 1  Housing: Low Risk (11/11/2022)   Housing    Last Housing Risk Score: 0  Utilities: Not on file  Health Literacy: Not on file    Outpatient Medications  Prior to Visit  Medication Sig Dispense Refill   albuterol  (VENTOLIN  HFA) 108 (90 Base) MCG/ACT inhaler Inhale 1-2 puffs into the lungs every 6 (six) hours as needed. 8 g 0   brompheniramine-pseudoephedrine-DM 30-2-10 MG/5ML syrup Take 5 mLs by mouth 4 (four) times daily as needed. 120 mL 0   cetirizine  (ZYRTEC ) 10 MG tablet Take 1 tablet (10 mg total) by mouth daily. 90 tablet 1   clotrimazole  (LOTRIMIN ) 1 % cream APPLY 1 APPLICATION TOPICALLY DAILY AS NEEDED (ATHLETE'S FOOT). 30 g 1   Fe Fum-FePoly-FA-Vit C-Vit B3 (INTEGRA F ) 125-1 MG CAPS TAKE 1 CAPSULE BY MOUTH DAILY 30 capsule 6   fluconazole  (DIFLUCAN ) 150 MG tablet TAKE 1 TABLET BY MOUTH NOW, THEN REPEAT IN 72 HOURS IF 2 tablet 0   hydrOXYzine  (VISTARIL ) 25 MG capsule Take 1 capsule (25 mg total) by mouth every 8 (eight) hours as needed for anxiety. 30 capsule 0   pantoprazole  (PROTONIX ) 40 MG tablet Take 1 tablet (40 mg total) by mouth 2 (two) times daily. 60 tablet 0   valACYclovir  (VALTREX ) 500 MG tablet Take 1 tablet (500 mg total) by mouth daily. 90 tablet 0   amoxicillin -clavulanate (AUGMENTIN ) 875-125 MG tablet Take 1 tablet by mouth 2 (two) times daily. 14 tablet 0   fluticasone  (FLONASE ) 50 MCG/ACT nasal spray Place 2 sprays into both nostrils daily. 16 g 0   No  facility-administered medications prior to visit.    Allergies[1]  Review of Systems  Constitutional:  Negative for weight loss.  HENT:  Negative for congestion and hearing loss.   Eyes:  Negative for blurred vision.  Respiratory:  Negative for cough.   Cardiovascular:  Negative for leg swelling.  Gastrointestinal:  Negative for constipation and diarrhea.  Genitourinary:  Negative for dysuria and frequency.  Musculoskeletal:  Positive for back pain (mid back sometimes with movement). Negative for joint pain and myalgias.  Skin:  Negative for rash.  Neurological:  Positive for headaches (premenstrual only).  Psychiatric/Behavioral:  Negative for depression. The patient is not nervous/anxious.        Objective:    Physical Exam Exam conducted with a chaperone present.      BP 122/80 (BP Location: Left Arm, Patient Position: Sitting, Cuff Size: Small)   Pulse 77   Temp 98.7 F (37.1 C) (Oral)   Resp 16   Ht 5' 2 (1.575 m)   Wt 153 lb (69.4 kg)   SpO2 100%   BMI 27.98 kg/m  Wt Readings from Last 3 Encounters:  02/26/24 153 lb (69.4 kg)  12/23/23 152 lb (68.9 kg)  11/15/23 145 lb (65.8 kg)   Physical Exam  Constitutional: She is oriented to person, place, and time. She appears well-developed and well-nourished. No distress.  HENT:  Head: Normocephalic and atraumatic.  Right Ear: Tympanic membrane and ear canal normal.  Left Ear: Tympanic membrane and ear canal normal.  Mouth/Throat: Oropharynx is clear and moist.  Eyes: Pupils are equal, round, and reactive to light. No scleral icterus.  Neck: Normal range of motion. No thyromegaly present.  Cardiovascular: Normal rate and regular rhythm.   No murmur heard. Pulmonary/Chest: Effort normal and breath sounds normal. No respiratory distress. He has no wheezes. She has no rales. She exhibits no tenderness.  Abdominal: Soft. Bowel sounds are normal. She exhibits no distension and no mass. There is no tenderness. There is  no rebound and no guarding.  Musculoskeletal: She exhibits no edema.  Lymphadenopathy:    She has no  cervical adenopathy.  Neurological: She is alert and oriented to person, place, and time. She has normal patellar reflexes. She exhibits normal muscle tone. Coordination normal.  Skin: Skin is warm and dry.  Psychiatric: She has a normal mood and affect. Her behavior is normal. Judgment and thought content normal.  Breasts: Examined lying Right: Without masses, retractions, discharge or axillary adenopathy.  Left: Without masses, retractions, discharge or axillary adenopathy.  Inguinal/mons: Normal without inguinal adenopathy  External genitalia: Normal  BUS/Urethra/Skene's glands: Normal  Bladder: Normal  Vagina: Normal  Cervix: Normal  Uterus: normal in size, shape and contour. Midline and mobile  Adnexa/parametria:  Rt: Without masses or tenderness.  Lt: Without masses or tenderness.  Anus and perineum: Normal            Assessment & Plan:       Assessment & Plan:   Problem List Items Addressed This Visit       Unprioritized   Tobacco abuse    Continues smoking one pack per day. Encouraged cessation.      Preventative health care    Routine wellness visit with focus on health maintenance. - Administered pneumonia vaccine. - Ordered mammogram. - Encouraged healthy diet and exercise. - Discussed importance of regular eye and dental exams. - prevnar today.      Iron  deficiency anemia   Relevant Orders   CBC w/Diff   IBC + Ferritin   GERD (gastroesophageal reflux disease)   GERD managed with Pantoprazole . - Refilled Pantoprazole  prescription.      Other Visit Diagnoses       Encounter for routine gynecological examination with Papanicolaou smear of cervix    -  Primary   Relevant Orders   Cytology - PAP( [place])     Breast cancer screening by mammogram       Relevant Orders   MM 3D SCREENING MAMMOGRAM BILATERAL BREAST     Weight gain        Relevant Orders   TSH     Hyperglycemia       Relevant Orders   Comp Met ([facility])   HgB A1c     Screening for lipoid disorders       Relevant Orders   Lipid panel     Screening examination for STI       Relevant Orders   Hepatitis C Antibody   HIV antibody (with reflex)   Cervicovaginal ancillary only( [place])     Need for pneumococcal 20-valent conjugate vaccination       Relevant Orders   Pneumococcal conjugate vaccine 20-valent (Prevnar 20) (Completed)       Assessment & Plan    I have discontinued Alline L. Milliner Raven's fluticasone  and amoxicillin -clavulanate. I am also having her maintain her albuterol , brompheniramine-pseudoephedrine-DM, Integra F , clotrimazole , cetirizine , pantoprazole , hydrOXYzine , valACYclovir , and fluconazole .  No orders of the defined types were placed in this encounter.     [1] No Known Allergies  "

## 2024-02-26 NOTE — Assessment & Plan Note (Signed)
 GERD managed with Pantoprazole . - Refilled Pantoprazole  prescription.

## 2024-03-12 ENCOUNTER — Ambulatory Visit (HOSPITAL_BASED_OUTPATIENT_CLINIC_OR_DEPARTMENT_OTHER)

## 2025-02-28 ENCOUNTER — Encounter: Admitting: Family
